# Patient Record
Sex: Male | Born: 1980 | Race: White | Hispanic: No | Marital: Married | Smoking: Former smoker
Health system: Southern US, Community
[De-identification: ages and names within clinical notes are randomized; demographics above are authoritative.]

## PROBLEM LIST (undated history)

## (undated) DIAGNOSIS — E781 Pure hyperglyceridemia: Secondary | ICD-10-CM

## (undated) DIAGNOSIS — S42301A Unspecified fracture of shaft of humerus, right arm, initial encounter for closed fracture: Secondary | ICD-10-CM

## (undated) DIAGNOSIS — E039 Hypothyroidism, unspecified: Secondary | ICD-10-CM

## (undated) DIAGNOSIS — S4991XA Unspecified injury of right shoulder and upper arm, initial encounter: Secondary | ICD-10-CM

## (undated) DIAGNOSIS — S82891A Other fracture of right lower leg, initial encounter for closed fracture: Secondary | ICD-10-CM

## (undated) DIAGNOSIS — R945 Abnormal results of liver function studies: Secondary | ICD-10-CM

## (undated) DIAGNOSIS — F419 Anxiety disorder, unspecified: Secondary | ICD-10-CM

## (undated) DIAGNOSIS — S93409A Sprain of unspecified ligament of unspecified ankle, initial encounter: Secondary | ICD-10-CM

## (undated) DIAGNOSIS — G44229 Chronic tension-type headache, not intractable: Secondary | ICD-10-CM

## (undated) HISTORY — DX: Abnormal results of liver function studies: R94.5

## (undated) HISTORY — DX: Pure hyperglyceridemia: E78.1

## (undated) HISTORY — DX: Sprain of unspecified ligament of unspecified ankle, initial encounter: S93.409A

## (undated) HISTORY — DX: Other fracture of right lower leg, initial encounter for closed fracture: S82.891A

## (undated) HISTORY — DX: Anxiety disorder, unspecified: F41.9

## (undated) HISTORY — DX: Unspecified fracture of shaft of humerus, right arm, initial encounter for closed fracture: S42.301A

## (undated) HISTORY — DX: Chronic tension-type headache, not intractable: G44.229

## (undated) HISTORY — DX: Hypothyroidism, unspecified: E03.9

## (undated) HISTORY — DX: Unspecified injury of right shoulder and upper arm, initial encounter: S49.91XA

---
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
## 2002-06-11 HISTORY — PX: WISDOM TOOTH EXTRACTION: SHX21

## 2004-06-11 DIAGNOSIS — S4991XA Unspecified injury of right shoulder and upper arm, initial encounter: Secondary | ICD-10-CM

## 2004-06-11 HISTORY — DX: Unspecified injury of right shoulder and upper arm, initial encounter: S49.91XA

## 2007-01-14 ENCOUNTER — Ambulatory Visit: Payer: Self-pay | Admitting: Family Medicine

## 2007-01-14 DIAGNOSIS — F411 Generalized anxiety disorder: Secondary | ICD-10-CM | POA: Insufficient documentation

## 2007-02-05 ENCOUNTER — Ambulatory Visit: Payer: Self-pay | Admitting: Family Medicine

## 2007-03-26 ENCOUNTER — Ambulatory Visit: Payer: Self-pay | Admitting: Internal Medicine

## 2007-03-26 ENCOUNTER — Ambulatory Visit: Payer: Self-pay | Admitting: Family Medicine

## 2007-04-02 LAB — CONVERTED CEMR LAB
ALT: 70 units/L — ABNORMAL HIGH (ref 0–53)
AST: 41 units/L — ABNORMAL HIGH (ref 0–37)
Bilirubin, Direct: 0.1 mg/dL (ref 0.0–0.3)
CO2: 28 meq/L (ref 19–32)
Calcium: 9.1 mg/dL (ref 8.4–10.5)
Cholesterol: 199 mg/dL (ref 0–200)
Creatinine, Ser: 0.8 mg/dL (ref 0.4–1.5)
Direct LDL: 129.7 mg/dL
GFR calc Af Amer: 149 mL/min
GFR calc non Af Amer: 123 mL/min
Glucose, Bld: 94 mg/dL (ref 70–99)
HCT: 44.4 % (ref 39.0–52.0)
Hemoglobin: 15.1 g/dL (ref 13.0–17.0)
Lymphocytes Relative: 42.5 % (ref 12.0–46.0)
Monocytes Relative: 9.9 % (ref 3.0–11.0)
Neutro Abs: 2.8 10*3/uL (ref 1.4–7.7)
Platelets: 281 10*3/uL (ref 150–400)
Potassium: 3.9 meq/L (ref 3.5–5.1)
RBC: 4.91 M/uL (ref 4.22–5.81)
Total Bilirubin: 1 mg/dL (ref 0.3–1.2)
VLDL: 43 mg/dL — ABNORMAL HIGH (ref 0–40)

## 2007-04-21 ENCOUNTER — Ambulatory Visit: Payer: Self-pay | Admitting: Family Medicine

## 2007-04-21 DIAGNOSIS — E039 Hypothyroidism, unspecified: Secondary | ICD-10-CM | POA: Insufficient documentation

## 2007-04-21 DIAGNOSIS — E782 Mixed hyperlipidemia: Secondary | ICD-10-CM | POA: Insufficient documentation

## 2007-04-21 DIAGNOSIS — R945 Abnormal results of liver function studies: Secondary | ICD-10-CM | POA: Insufficient documentation

## 2007-04-24 ENCOUNTER — Encounter: Admission: RE | Admit: 2007-04-24 | Discharge: 2007-04-24 | Payer: Self-pay | Admitting: Family Medicine

## 2007-06-10 ENCOUNTER — Ambulatory Visit: Payer: Self-pay | Admitting: Internal Medicine

## 2007-06-11 ENCOUNTER — Encounter (INDEPENDENT_AMBULATORY_CARE_PROVIDER_SITE_OTHER): Payer: Self-pay | Admitting: Internal Medicine

## 2007-08-14 ENCOUNTER — Ambulatory Visit: Payer: Self-pay | Admitting: Family Medicine

## 2007-08-15 LAB — CONVERTED CEMR LAB
Direct LDL: 107.3 mg/dL
HDL: 27.3 mg/dL — ABNORMAL LOW (ref >39.0)
Triglycerides: 281 mg/dL (ref 0–149)

## 2007-08-21 ENCOUNTER — Encounter (INDEPENDENT_AMBULATORY_CARE_PROVIDER_SITE_OTHER): Payer: Self-pay | Admitting: *Deleted

## 2007-11-10 ENCOUNTER — Telehealth (INDEPENDENT_AMBULATORY_CARE_PROVIDER_SITE_OTHER): Payer: Self-pay | Admitting: *Deleted

## 2008-06-15 ENCOUNTER — Ambulatory Visit: Payer: Self-pay | Admitting: Family Medicine

## 2008-06-15 DIAGNOSIS — J069 Acute upper respiratory infection, unspecified: Secondary | ICD-10-CM | POA: Insufficient documentation

## 2008-06-24 ENCOUNTER — Ambulatory Visit: Payer: Self-pay | Admitting: Family Medicine

## 2008-06-24 DIAGNOSIS — M25579 Pain in unspecified ankle and joints of unspecified foot: Secondary | ICD-10-CM | POA: Insufficient documentation

## 2008-06-24 DIAGNOSIS — S93409A Sprain of unspecified ligament of unspecified ankle, initial encounter: Secondary | ICD-10-CM | POA: Insufficient documentation

## 2008-07-15 ENCOUNTER — Ambulatory Visit: Payer: Self-pay | Admitting: Family Medicine

## 2008-08-10 ENCOUNTER — Ambulatory Visit: Payer: Self-pay | Admitting: Family Medicine

## 2008-08-11 LAB — CONVERTED CEMR LAB
ALT: 22 units/L (ref 0–53)
Alkaline Phosphatase: 75 units/L (ref 39–117)
Bilirubin, Direct: 0.1 mg/dL (ref 0.0–0.3)
Cholesterol: 209 mg/dL (ref 0–200)
HDL: 29.6 mg/dL — ABNORMAL LOW (ref >39.0)
Total CHOL/HDL Ratio: 7.1
Triglycerides: 245 mg/dL (ref 0–149)
VLDL: 49 mg/dL — ABNORMAL HIGH (ref 0–40)

## 2009-02-10 ENCOUNTER — Ambulatory Visit: Payer: Self-pay | Admitting: Family Medicine

## 2009-02-11 LAB — CONVERTED CEMR LAB
Cholesterol: 164 mg/dL (ref 0–200)
LDL Cholesterol: 101 mg/dL — ABNORMAL HIGH (ref 0–99)

## 2009-06-08 ENCOUNTER — Ambulatory Visit: Payer: Self-pay | Admitting: Family Medicine

## 2009-08-24 ENCOUNTER — Ambulatory Visit: Payer: Self-pay | Admitting: Family Medicine

## 2009-08-25 LAB — CONVERTED CEMR LAB
LDL Cholesterol: 110 mg/dL — ABNORMAL HIGH (ref 0–99)
Triglycerides: 176 mg/dL — ABNORMAL HIGH (ref 0.0–149.0)

## 2010-01-17 ENCOUNTER — Ambulatory Visit: Payer: Self-pay | Admitting: Family Medicine

## 2010-07-12 NOTE — Assessment & Plan Note (Signed)
Summary: TRANSFER FROM BILLIE/REFILL MED/CLE   Vital Signs:  Patient profile:   30 year old male Height:      73.25 inches Weight:      233.50 pounds BMI:     30.71 Temp:     97.8 degrees F oral Pulse rate:   84 / minute Pulse rhythm:   regular BP sitting:   122 / 70  (left arm) Cuff size:   large  Vitals Entered By: Delilah Shan CMA Duncan Dull) (January 17, 2010 3:01 PM) CC: Transfer from BDB   History of Present Illness: Hypothyroidism-  no symptoms of over/underreplacement.  No neck mass, dysphagia.  Doing well.  Complaint.   H/o elevated TG.  d/w patient EA:VWUJ today.  Exercising and working on diet.  Improved from before.   Allergies: No Known Drug Allergies  Past History:  Past Medical History: Anxiety with stress tension headaches Hypothyroid elevated TG  Family History: Reviewed history from 01/14/2007 and no changes required. Father: L&W Mother: L&W Siblings: 2 half sisters--L&W                                                                                                     1br--L&W               2 half br--L&W                                                                                               DM --none MI--none CVA--none prostate ca--none breast/uterine/cervix--none colon ca--none   Social History: Reviewed history from 06/08/2009 and no changes required. 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  Otherwise negative.    Physical Exam  General:  GEN: nad, alert and oriented HEENT: mucous membranes moist NECK: supple w/o LA, no tmg  CV: rrr.  no murmur PULM: ctab, no inc wob ABD: soft, +bs EXT: no edema SKIN: no acute rash    Impression & Recommendations:  Problem # 1:  HYPOTHYROIDISM (ICD-244.9) No change in  meds.  recheck TSH in 1 year.  His updated medication list for this problem includes:    Levothroid 75 Mcg Tabs (Levothyroxine sodium) .Marland Kitchen... Take one by mouth daily  Problem # 2:  HYPERLIPIDEMIA (ICD-272.2) d/w patient.  continue exercise.  would recheck in 1-2 years.   Complete Medication List: 1)  One-daily Multivitamins Tabs (Multiple vitamin) .... Take one by mouth daily 2)  Levothroid 75 Mcg Tabs (Levothyroxine sodium) .... Take one by mouth daily  3)  Fish Oil Oil (Fish oil) .... Three daily  Patient Instructions: 1)  Don't change your meds.  Keep exercising and get a flu shot this fall.  Prescriptions: LEVOTHROID 75 MCG  TABS (LEVOTHYROXINE SODIUM) Take one by mouth daily  #90 x 4   Entered and Authorized by:   Crawford Givens MD   Signed by:   Crawford Givens MD on 01/17/2010   Method used:   Electronically to        [facility]* (retail)       [address]       [place], [place]  [address]       Ph: [phone]       Fax: [phone]   RxID:   [id number]   Current Allergies (reviewed today): No known allergies

## 2010-11-01 ENCOUNTER — Encounter: Payer: Self-pay | Admitting: Family Medicine

## 2010-11-03 ENCOUNTER — Ambulatory Visit (INDEPENDENT_AMBULATORY_CARE_PROVIDER_SITE_OTHER): Payer: 59 | Admitting: Family Medicine

## 2010-11-03 ENCOUNTER — Encounter: Payer: Self-pay | Admitting: Family Medicine

## 2010-11-03 VITALS — BP 130/80 | HR 67 | Temp 97.9°F | Ht 74.0 in | Wt 229.4 lb

## 2010-11-03 DIAGNOSIS — W57XXXA Bitten or stung by nonvenomous insect and other nonvenomous arthropods, initial encounter: Secondary | ICD-10-CM

## 2010-11-03 MED ORDER — DOXYCYCLINE HYCLATE 100 MG PO TABS: 100.0000 mg | ORAL_TABLET | Freq: Two times a day (BID) | ORAL | Status: AC

## 2010-11-03 NOTE — Assessment & Plan Note (Addendum)
Hold doxy for now as the exam is benign.  Border marked.  Use doxy if erythema spreads.  He understood.  Areas dressed with bandaid and neosporin.  He may have had a transient superficial infection of the epithelial disruption due to a bite, but he doesn't have spreading erythema and is well appearing.  D/w about risk of empiric abx and it is reasonable to hold for now.  I'm on call this weekend and patient can call the on call line as needed.  No indication for I&D,  I expect this to gradually self resolve.  He agreed with plan.

## 2010-11-03 NOTE — Progress Notes (Signed)
"  Bug bite."  Likely bug bites at wedding in [place].  Also had been doing some mountain biking.  Lesion on L calf was getting bigger and had to lance it at home.  It had clear/yellow discharge.  Smaller, similar lesions on R shin (x2).  He's keeping them clean.  No tick bites.  No FCNAV.  Areas are itchy.  Size increase has slowed down over last few days.  Feeling well o/w.  Meds, vitals, and allergies reviewed.   ROS: See HPI.  Otherwise, noncontributory.  nad Skin with 3 lesions noted.  1 on L calf, 2.x1.5cm with blanching erythema.  Resolving vesicle w/o purulent discharge noted.  Not ttp.   R shin with 2 lesions, 1 is 1x1cm with similar resolving vesicle and blanching erythema.  No purulent discharge.  Another lesion is flat w/o vesicle, 2.5x1.5cm.  It has blanching erythema.  None are tender.

## 2010-11-03 NOTE — Patient Instructions (Signed)
Hold the antibiotic for now.  Fill it if your have spreading redness. Let us know if this is the case.  The medicine can make you sun sensitive.  Take care.

## 2011-02-01 ENCOUNTER — Encounter: Payer: Self-pay | Admitting: Family Medicine

## 2011-02-01 ENCOUNTER — Ambulatory Visit (INDEPENDENT_AMBULATORY_CARE_PROVIDER_SITE_OTHER): Payer: 59 | Admitting: Family Medicine

## 2011-02-01 ENCOUNTER — Other Ambulatory Visit: Payer: Self-pay | Admitting: *Deleted

## 2011-02-01 VITALS — BP 122/70 | HR 72 | Temp 98.0°F | Wt 224.0 lb

## 2011-02-01 DIAGNOSIS — M549 Dorsalgia, unspecified: Secondary | ICD-10-CM

## 2011-02-01 DIAGNOSIS — E039 Hypothyroidism, unspecified: Secondary | ICD-10-CM

## 2011-02-01 MED ORDER — CYCLOBENZAPRINE HCL 10 MG PO TABS: 5.0000 mg | ORAL_TABLET | Freq: Three times a day (TID) | ORAL | Status: DC | PRN

## 2011-02-01 MED ORDER — LEVOTHYROXINE SODIUM 75 MCG PO TABS: 75.0000 ug | ORAL_TABLET | Freq: Every day | ORAL | Status: DC

## 2011-02-01 NOTE — Telephone Encounter (Signed)
TSH 2.68 on 08/24/09

## 2011-02-01 NOTE — Patient Instructions (Signed)
I would gently stretch your back, use a heating pad, take ibuprofen with food, and use the flexeril for the muscle tightness.  I would get the seat hight adjusted on your bike.   You can get your results through our phone system.  Follow the instructions on the blue card.  Take care.

## 2011-02-01 NOTE — Telephone Encounter (Signed)
Pt may need labs.

## 2011-02-01 NOTE — Progress Notes (Signed)
Back pain.  5 months ago [place]  He went to chiropracter and got adjustment.  He got some better.  Mountain biking recently, had been racing some.  Back started hurting 2 days ago.  "Usually sore in lower back after 18-19 miles. This isn't the usual soreness."  Taking ibuprofen with some relief of pain.  Pain is better at night, supine.  He'd like to keep biking.  L>R, lower L spine, paraspinal/facet area.  No weakness in legs.  Occ pain in L thigh, better with position change. His seat is high on his bike.   Hypothyroid, due for recheck TSH and needs refill.  No neck sx.   Meds, vitals, and allergies reviewed.   ROS: See HPI.  Otherwise, noncontributory.  nad ncat Neck supple, no masses Back w/o midline pain but B paraspinal tenderness and muscle tightness.  SLR neg but facet loading is pos.  Distally nv intact

## 2011-02-02 DIAGNOSIS — M549 Dorsalgia, unspecified: Secondary | ICD-10-CM | POA: Insufficient documentation

## 2011-02-02 LAB — TSH: TSH: 1.33 u[IU]/mL (ref 0.35–5.50)

## 2011-02-02 NOTE — Assessment & Plan Note (Signed)
Likely facet irritation and muscle spasm.  No need to image.  D/w pt about knee to chest, cat/camel stretch. Heat, nsaid with GI caution and flexeril prn.  He'll get set height adjusted.

## 2011-02-02 NOTE — Assessment & Plan Note (Signed)
Check TSH and continue meds.

## 2011-06-11 ENCOUNTER — Emergency Department (INDEPENDENT_AMBULATORY_CARE_PROVIDER_SITE_OTHER): Payer: BC Managed Care – PPO

## 2011-06-11 ENCOUNTER — Other Ambulatory Visit: Payer: Self-pay

## 2011-06-11 ENCOUNTER — Emergency Department (HOSPITAL_BASED_OUTPATIENT_CLINIC_OR_DEPARTMENT_OTHER)
Admission: EM | Admit: 2011-06-11 | Discharge: 2011-06-11 | Disposition: A | Discharge status: Home / Self Care | Payer: BC Managed Care – PPO | Attending: Emergency Medicine | Admitting: Emergency Medicine

## 2011-06-11 ENCOUNTER — Encounter (HOSPITAL_BASED_OUTPATIENT_CLINIC_OR_DEPARTMENT_OTHER): Payer: Self-pay | Admitting: Emergency Medicine

## 2011-06-11 DIAGNOSIS — E785 Hyperlipidemia, unspecified: Secondary | ICD-10-CM | POA: Insufficient documentation

## 2011-06-11 DIAGNOSIS — J8410 Pulmonary fibrosis, unspecified: Secondary | ICD-10-CM

## 2011-06-11 DIAGNOSIS — R079 Chest pain, unspecified: Secondary | ICD-10-CM

## 2011-06-11 DIAGNOSIS — E039 Hypothyroidism, unspecified: Secondary | ICD-10-CM | POA: Insufficient documentation

## 2011-06-11 DIAGNOSIS — Z79899 Other long term (current) drug therapy: Secondary | ICD-10-CM | POA: Insufficient documentation

## 2011-06-11 LAB — CBC
Hemoglobin: 15.4 g/dL (ref 13.0–17.0)
MCH: 29.7 pg (ref 26.0–34.0)
MCHC: 35.5 g/dL (ref 30.0–36.0)
MCV: 83.6 fL (ref 78.0–100.0)
Platelets: 254 10*3/uL (ref 150–400)
RBC: 5.19 MIL/uL (ref 4.22–5.81)
RDW: 12.4 % (ref 11.5–15.5)
WBC: 10.4 10*3/uL (ref 4.0–10.5)

## 2011-06-11 LAB — DIFFERENTIAL
Eosinophils Absolute: 0.1 10*3/uL (ref 0.0–0.7)
Lymphs Abs: 1.8 10*3/uL (ref 0.7–4.0)
Monocytes Absolute: 0.7 10*3/uL (ref 0.1–1.0)
Monocytes Relative: 7 % (ref 3–12)
Neutro Abs: 7.8 10*3/uL — ABNORMAL HIGH (ref 1.7–7.7)

## 2011-06-11 LAB — BASIC METABOLIC PANEL
Calcium: 9.4 mg/dL (ref 8.4–10.5)
Chloride: 102 mEq/L (ref 96–112)
Glucose, Bld: 101 mg/dL — ABNORMAL HIGH (ref 70–99)
Potassium: 3.9 mEq/L (ref 3.5–5.1)

## 2011-06-11 MED ORDER — ASPIRIN 81 MG PO CHEW
324.0000 mg | CHEWABLE_TABLET | Freq: Once | ORAL | Status: AC
  Administered 2011-06-11: 324 mg via ORAL
  Filled 2011-06-11: qty 4

## 2011-06-11 NOTE — ED Notes (Signed)
Denies pain at this time [place] PA in at side

## 2011-06-11 NOTE — ED Provider Notes (Signed)
History     CSN: [id number]  Arrival date & time 06/11/11  1803   First MD Initiated Contact with Patient 06/11/11 1928      Chief Complaint  Patient presents with  . Chest Pain    (Consider location/radiation/quality/duration/timing/severity/associated sxs/prior treatment) Patient is a 30 y.o. male presenting with chest pain. The history is provided by the patient. No language interpreter was used.  Chest Pain The chest pain began more than 2 weeks ago. Chest pain occurs constantly. The chest pain is unchanged. The pain is associated with stress. At its most intense, the pain is at 8/10. The pain is currently at 8/10. The severity of the pain is moderate. The quality of the pain is described as aching and brief. The pain does not radiate. Chest pain is worsened by stress. He tried nothing for the symptoms. Risk factors include male gender.  Pertinent negatives for past medical history include no diabetes, no hyperlipidemia and no hypertension.  Pertinent negatives for family medical history include: no CAD in family, no heart disease in family and no hypertension in family.  Procedure history is negative for cardiac catheterization, echocardiogram and exercise treadmill test.    Pt reports he has had some chest discomfort on and off for the past 3 weeks.  Pt reports he had an episode today at work around 3pm.   Pt reports he has a sharp pain in his chest.  Pt reports the pain makes him feel anxious.  Pt reports he has had increased stress.  Pt is followed by [facility] Past Medical History  Diagnosis Date  . Anxiety     with stress  . Chronic tension headaches   . Hypothyroid   . High triglycerides   . Sprain of ankle, unspecified site     Left  . Nonspecific abnormal results of liver function study   . Acute upper respiratory infections of unspecified site   . Right shoulder injury 2006    no surgery; no fracture  . Right arm fracture 1988  . Ankle fracture, right  1996    ?; not sure if it was a break or growth plate separation    Past Surgical History  Procedure Date  . Hernia repair 1987    bilateral  . Wisdom tooth extraction 2004    Family History  Problem Relation Age of Onset  . Healthy Father   . Healthy Mother   . Healthy Sister     halfsister  . Healthy Sister     halfsister  . Healthy Brother     halfbrother  . Healthy Brother     halfbrother    History  Substance Use Topics  . Smoking status: Former Games developer  . Smokeless tobacco: Not on file  . Alcohol Use: Yes     some on weekends      Review of Systems  Cardiovascular: Positive for chest pain.  All other systems reviewed and are negative.    Allergies  Review of patient's allergies indicates no known allergies.  Home Medications   Current Outpatient Rx  Name Route Sig Dispense Refill  . ASPIRIN 325 MG PO TBEC Oral Take 325 mg by mouth daily.      . OMEGA-3 FATTY ACIDS 1000 MG PO CAPS Oral Take 2 g by mouth 2 (two) times daily.      . IBUPROFEN 200 MG PO TABS Oral Take 400 mg by mouth every 6 (six) hours as needed. For pain     .  LEVOTHYROXINE SODIUM 75 MCG PO TABS Oral Take 1 tablet (75 mcg total) by mouth daily. 90 tablet 3  . ONE-DAILY MULTI VITAMINS PO TABS Oral Take 1 tablet by mouth daily.        BP 162/81  Pulse 84  Temp(Src) 97.9 F (36.6 C) (Oral)  Resp 16  SpO2 100%  Physical Exam  Nursing note and vitals reviewed. Constitutional: He is oriented to person, place, and time. He appears well-developed.  HENT:  Head: Normocephalic and atraumatic.  Right Ear: External ear normal.  Left Ear: External ear normal.  Mouth/Throat: Oropharynx is clear and moist.  Eyes: Conjunctivae are normal. Pupils are equal, round, and reactive to light.  Neck: Normal range of motion. Neck supple.  Cardiovascular: Normal rate and normal heart sounds.   Pulmonary/Chest: He exhibits tenderness.  Abdominal: Soft.  Musculoskeletal: Normal range of motion.    Neurological: He is alert and oriented to person, place, and time. He has normal reflexes.  Skin: Skin is warm.  Psychiatric: He has a normal mood and affect.    ED Course  Procedures (including critical care time)  Labs Reviewed - No data to display No results found.   No diagnosis found.    MDM  Pt given 324 of aspirin.  Labs normal,  Chest xray and EKG reviewed.  I advised aspirin.  Schedule appointment at [facility] for recheck and to schedule possible stress test.  I doubt cardiac probable stress        Langston Masker, [place] 06/11/11 2108

## 2011-06-11 NOTE — ED Notes (Signed)
Pt reports multiple episodes of  sharp CP to RT 7 LT side chest x 10 days; reports he becomes anxious when CP occurs & feels like he is going to have a panic attack

## 2011-06-12 NOTE — ED Provider Notes (Signed)
History/physical exam/procedure(s) were performed by non-physician practitioner and as supervising physician I was immediately available for consultation/collaboration. I have reviewed all notes and am in agreement with care and plan.   Hilario Quarry, MD 06/12/11 [phone]

## 2011-06-14 ENCOUNTER — Ambulatory Visit (INDEPENDENT_AMBULATORY_CARE_PROVIDER_SITE_OTHER): Payer: BC Managed Care – PPO | Admitting: Family Medicine

## 2011-06-14 ENCOUNTER — Encounter: Payer: Self-pay | Admitting: Family Medicine

## 2011-06-14 DIAGNOSIS — R0789 Other chest pain: Secondary | ICD-10-CM

## 2011-06-14 NOTE — Patient Instructions (Signed)
Cut out the chocolate and alcohol for now.  Take pepcid twice a day for about 2 weeks. If not fully better, then try taking prilosec 20mg  a day for about 2 weeks.  If still not better, call me.  Try to get some rest and start back exercising gradually.

## 2011-06-14 NOTE — Progress Notes (Signed)
Was at ER 06/11/11 for CP.  Had unremarkable ER eval, now on pepcid AC before meals twice daily and the pain is some better but not resolved.  Still with some lower chest and upper abd pain, anterior.  He'd had episodes of this prev, but this episode has lasted longer. This is the worst time of year for him at work.  Sometimes the pain gets worse with a meal- this happened yesterday.    He's looking at his diet.  Worse with coffee.  He cut out etoh and chocolate.  Work is still tough, in terms of his schedule.  He's also taking classes online.  He isn't biking as much with the weather changes.   No early CAD in family.  No personal hx CAD.  No cocaine use.  BP wnl today.  He'd been able to bike up to 30 miles per day this summer.    Meds, vitals, and allergies reviewed.   ROS: See HPI.  Otherwise, noncontributory.  GEN: nad, alert and oriented, affect wnl HEENT: mucous membranes moist NECK: supple w/o LA CV: rrr.  no murmur, chest wall not ttp PULM: ctab, no inc wob ABD: soft, +bs, minimally ttp in epigastrum EXT: no edema SKIN: no acute rash

## 2011-06-15 ENCOUNTER — Encounter: Payer: Self-pay | Admitting: Family Medicine

## 2011-06-15 DIAGNOSIS — R0789 Other chest pain: Secondary | ICD-10-CM | POA: Insufficient documentation

## 2011-06-15 NOTE — Assessment & Plan Note (Signed)
Very unlikely CV source given his prev exercise tolerance.  Likely GERD related.  Continue H2 blocker, work on diet and exercise, can change to PPI if not resolved. If still with sx beyond that, then f/u here.  He agrees. Defer further w/u at this time.  He agrees with plan.

## 2012-01-16 ENCOUNTER — Other Ambulatory Visit: Payer: Self-pay | Admitting: *Deleted

## 2012-01-16 MED ORDER — LEVOTHYROXINE SODIUM 75 MCG PO TABS: 75.0000 ug | ORAL_TABLET | Freq: Every day | ORAL | Status: DC

## 2012-08-27 ENCOUNTER — Ambulatory Visit (INDEPENDENT_AMBULATORY_CARE_PROVIDER_SITE_OTHER): Payer: BC Managed Care – PPO | Admitting: Family Medicine

## 2012-08-27 ENCOUNTER — Encounter: Payer: Self-pay | Admitting: Family Medicine

## 2012-08-27 VITALS — BP 120/70 | HR 68 | Temp 98.0°F | Wt 237.0 lb

## 2012-08-27 DIAGNOSIS — J069 Acute upper respiratory infection, unspecified: Secondary | ICD-10-CM | POA: Insufficient documentation

## 2012-08-27 NOTE — Assessment & Plan Note (Signed)
No reason to suspect ominous dx, d/w pt.  Likely just "bad luck" with several URIs (as would occ be expected).  Reassuring exam, supportive tx and f/u prn.  He agrees.  Well appearing.

## 2012-08-27 NOTE — Patient Instructions (Addendum)
This looks like a cold and it should resolve.  Take care and start exrcising when the weather improves.

## 2012-08-27 NOTE — Progress Notes (Signed)
His prev atypical CP had resolved after a few weeks of pepcid.    He's been sick 3 times in the last 3 months.  He had a flu shot, likely had the flu in December (mild per patient, wasn't tested).  Prev this month with cold sx, ST, HA, sinus pressure.  It resolved.  Now with return of similar sx in the last few days.  Prev he had been well for years.   In last few days, had aches, rhinorrhea, mild ST, dry nose, sinus pressure.  Not as severe as the episodes earlier this month.  No rash, no ear pain but occ intermittent ear ringing x 2.    Continues to work long hours and exercise has been disrupted with bad weather.    GEN: nad, alert and oriented HEENT: mucous membranes moist, tm w/o erythema, nasal exam w/o erythema, clear discharge noted,  OP with cobblestoning, R>L SOM noted NECK: supple w/o LA CV: rrr.   PULM: ctab, no inc wob EXT: no edema SKIN: no acute rash

## 2013-02-23 ENCOUNTER — Other Ambulatory Visit: Payer: Self-pay | Admitting: Family Medicine

## 2013-02-23 DIAGNOSIS — E039 Hypothyroidism, unspecified: Secondary | ICD-10-CM

## 2013-02-23 NOTE — Telephone Encounter (Signed)
Electronic refill request. Patient not seen in some time.  Please advise.

## 2013-02-23 NOTE — Telephone Encounter (Signed)
Seen in March.  Sent  Needs TSH before OV this fall.

## 2013-02-24 NOTE — Telephone Encounter (Signed)
Left detailed message on voicemail.  

## 2013-09-03 ENCOUNTER — Other Ambulatory Visit: Payer: Self-pay | Admitting: Family Medicine

## 2013-09-03 DIAGNOSIS — E039 Hypothyroidism, unspecified: Secondary | ICD-10-CM

## 2013-09-03 NOTE — Telephone Encounter (Signed)
Electronic refill request. Last OV:  08/27/12  Last filled:  90 tablets 1RF on  02/23/2013  Patient has not been seen in greater than 1 year with no upcoming appt scheduled.  According to refill protocol, please advise.

## 2013-09-03 NOTE — Telephone Encounter (Signed)
Get him a lab appointment and then a f/u here. visit. Thanks. rx sent.  Order is in.

## 2013-09-03 NOTE — Telephone Encounter (Signed)
Patient advised.  Lab appt scheduled and OV scheduled. 

## 2013-09-04 ENCOUNTER — Other Ambulatory Visit (INDEPENDENT_AMBULATORY_CARE_PROVIDER_SITE_OTHER): Payer: BC Managed Care – PPO

## 2013-09-04 DIAGNOSIS — E782 Mixed hyperlipidemia: Secondary | ICD-10-CM

## 2013-09-04 DIAGNOSIS — E039 Hypothyroidism, unspecified: Secondary | ICD-10-CM

## 2013-09-04 LAB — TSH: TSH: 1.34 u[IU]/mL (ref 0.35–5.50)

## 2013-09-10 ENCOUNTER — Ambulatory Visit (INDEPENDENT_AMBULATORY_CARE_PROVIDER_SITE_OTHER): Payer: BC Managed Care – PPO | Admitting: Family Medicine

## 2013-09-10 ENCOUNTER — Encounter: Payer: Self-pay | Admitting: Family Medicine

## 2013-09-10 VITALS — BP 114/80 | HR 63 | Temp 98.0°F | Wt 234.0 lb

## 2013-09-10 DIAGNOSIS — Z9109 Other allergy status, other than to drugs and biological substances: Secondary | ICD-10-CM | POA: Insufficient documentation

## 2013-09-10 DIAGNOSIS — E039 Hypothyroidism, unspecified: Secondary | ICD-10-CM

## 2013-09-10 DIAGNOSIS — L989 Disorder of the skin and subcutaneous tissue, unspecified: Secondary | ICD-10-CM

## 2013-09-10 DIAGNOSIS — L409 Psoriasis, unspecified: Secondary | ICD-10-CM | POA: Insufficient documentation

## 2013-09-10 MED ORDER — LEVOTHYROXINE SODIUM 75 MCG PO TABS: ORAL_TABLET | ORAL | Status: DC

## 2013-09-10 MED ORDER — LORATADINE 10 MG PO TABS: 10.0000 mg | ORAL_TABLET | Freq: Every day | ORAL | Status: DC

## 2013-09-10 NOTE — Assessment & Plan Note (Signed)
No neck mass.  No dysphagia.  TSH wnl.  Labs d/w pt.

## 2013-09-10 NOTE — Assessment & Plan Note (Signed)
Possible scar tissue on the lateral dorsum on L hand, not ttp.  Wouldn't benefit from xray.  Would not I&D for possible FB given the duration and the lack of sx.  He agrees.

## 2013-09-10 NOTE — Assessment & Plan Note (Signed)
Likely, with cats, can try OTC antihistamine and avoidance.

## 2013-09-10 NOTE — Patient Instructions (Signed)
Don't change your thyroid medicine.   Recheck in about 1 year.  Try claritin before being around the cats.  See if that helps.

## 2013-09-10 NOTE — Progress Notes (Signed)
Pre visit review using our clinic review tool, if applicable. No additional management support is needed unless otherwise documented below in the visit note.  He was cutting roses in 12/14 and scraped his L hand.  He felt something at the site since then but not ttp  Hypothyroid.  No neck mass.  No dysphagia.  TSH wnl.  Labs d/w pt.   He has some episodic SOB, no wheezing.  No cough.  Unclear if allergy sx.  Some throat clearing.  Sx worse with cat exposure.    Meds, vitals, and allergies reviewed.   ROS: See HPI.  Otherwise, noncontributory.  GEN: nad, alert and oriented HEENT: mucous membranes moist NECK: supple w/o LA, no TMG CV: rrr.  PULM: ctab, no inc wob ABD: soft, +bs EXT: no edema SKIN: no acute rash but possible scar tissue on the lateral dorsum on L hand, not ttp

## 2013-10-08 ENCOUNTER — Ambulatory Visit (INDEPENDENT_AMBULATORY_CARE_PROVIDER_SITE_OTHER): Payer: BC Managed Care – PPO | Admitting: Family Medicine

## 2013-10-08 ENCOUNTER — Encounter: Payer: Self-pay | Admitting: Family Medicine

## 2013-10-08 VITALS — BP 118/78 | HR 63 | Temp 97.7°F | Wt 231.5 lb

## 2013-10-08 DIAGNOSIS — L989 Disorder of the skin and subcutaneous tissue, unspecified: Secondary | ICD-10-CM

## 2013-10-08 NOTE — Assessment & Plan Note (Signed)
He could have had a transiently inflamed lesion near the L nipple w/o being involved with the nipple per se.  Much improved now.  Would follow clinically.  If persisting, worsening, then he'll notify me.  It is clearly better already per patient report.

## 2013-10-08 NOTE — Patient Instructions (Signed)
Notify me if the area gets more tender or enlarged. Take care.

## 2013-10-08 NOTE — Progress Notes (Signed)
Pre visit review using our clinic review tool, if applicable. No additional management support is needed unless otherwise documented below in the visit note.  Saturday he had L nipple pain.  By that night it was more puffy.  "It felt like an ingrown hair."  Felt something under the nipple.  Sunday it was some better, less puffy.  Better again on Monday.  Not painful now.  Minimally palpable mass noted now.  No R sided sx  Feels well o/w.  No vision change, no FCNAVD.  No nipple discharge.  Prev superior edge of the nipple was red tender and puffy, not at all now.    Meds, vitals, and allergies reviewed.   ROS: See HPI.  Otherwise, noncontributory.  nad Very small possible lesion deep in the skin at L nipple, on the superior edge of the nipple.  Not ttp now.   No mass o/w.  No LA on the clavicle or in the axilla.   rrr ctab

## 2014-12-15 ENCOUNTER — Other Ambulatory Visit: Payer: Self-pay | Admitting: Family Medicine

## 2015-01-11 ENCOUNTER — Other Ambulatory Visit: Payer: Self-pay | Admitting: Family Medicine

## 2015-01-11 ENCOUNTER — Other Ambulatory Visit (INDEPENDENT_AMBULATORY_CARE_PROVIDER_SITE_OTHER): Payer: BLUE CROSS/BLUE SHIELD

## 2015-01-11 DIAGNOSIS — Z131 Encounter for screening for diabetes mellitus: Secondary | ICD-10-CM

## 2015-01-11 DIAGNOSIS — E039 Hypothyroidism, unspecified: Secondary | ICD-10-CM

## 2015-01-11 LAB — GLUCOSE, RANDOM: Glucose, Bld: 90 mg/dL (ref 70–99)

## 2015-01-11 LAB — TSH: TSH: 2.4 u[IU]/mL (ref 0.35–4.50)

## 2015-01-12 ENCOUNTER — Other Ambulatory Visit: Payer: Self-pay

## 2015-01-14 ENCOUNTER — Other Ambulatory Visit: Payer: Self-pay | Admitting: Family Medicine

## 2015-01-20 ENCOUNTER — Encounter: Payer: Self-pay | Admitting: Family Medicine

## 2015-01-20 ENCOUNTER — Ambulatory Visit (INDEPENDENT_AMBULATORY_CARE_PROVIDER_SITE_OTHER): Payer: BLUE CROSS/BLUE SHIELD | Admitting: Family Medicine

## 2015-01-20 VITALS — BP 108/62 | HR 83 | Temp 98.3°F | Wt 234.5 lb

## 2015-01-20 DIAGNOSIS — E039 Hypothyroidism, unspecified: Secondary | ICD-10-CM | POA: Diagnosis not present

## 2015-01-20 DIAGNOSIS — Z7189 Other specified counseling: Secondary | ICD-10-CM | POA: Diagnosis not present

## 2015-01-20 MED ORDER — LEVOTHYROXINE SODIUM 75 MCG PO TABS: 75.0000 ug | ORAL_TABLET | Freq: Every day | ORAL | Status: DC

## 2015-01-20 NOTE — Progress Notes (Signed)
Pre visit review using our clinic review tool, if applicable. No additional management support is needed unless otherwise documented below in the visit note.  Has a 31.6 month old son.  Wife was able to get some time off.  He's in daycare.  Family is doing well overall.    Hypothyroid.  Complaint. No masses, no lumps.  No dysphagia.  TSH wnl.    He had tdap done this year.    Meds, vitals, and allergies reviewed.   ROS: See HPI.  Otherwise, noncontributory.  GEN: nad, alert and oriented HEENT: mucous membranes moist NECK: supple w/o LA, no tmg CV: rrr.  PULM: ctab, no inc wob ABD: soft, +bs EXT: no edema SKIN: no acute rash

## 2015-01-20 NOTE — Patient Instructions (Signed)
Congrats.  Take care.  Glad to see you.

## 2015-01-21 ENCOUNTER — Encounter: Payer: Self-pay | Admitting: Family Medicine

## 2015-01-21 DIAGNOSIS — Z7189 Other specified counseling: Secondary | ICD-10-CM | POA: Insufficient documentation

## 2015-01-21 NOTE — Assessment & Plan Note (Signed)
Controlled, continue as is.  Doing well.  Recheck in about 1 year.

## 2015-08-18 ENCOUNTER — Encounter: Payer: Self-pay | Admitting: Family Medicine

## 2015-08-18 ENCOUNTER — Ambulatory Visit (INDEPENDENT_AMBULATORY_CARE_PROVIDER_SITE_OTHER): Payer: BLUE CROSS/BLUE SHIELD | Admitting: Family Medicine

## 2015-08-18 VITALS — BP 102/74 | HR 64 | Temp 97.4°F | Wt 233.2 lb

## 2015-08-18 DIAGNOSIS — R059 Cough, unspecified: Secondary | ICD-10-CM | POA: Insufficient documentation

## 2015-08-18 DIAGNOSIS — R05 Cough: Secondary | ICD-10-CM | POA: Diagnosis not present

## 2015-08-18 MED ORDER — AZITHROMYCIN 250 MG PO TABS
ORAL_TABLET | ORAL | Status: DC
Start: 2015-08-18 — End: 2016-01-17

## 2015-08-18 MED ORDER — BENZONATATE 200 MG PO CAPS: 200.0000 mg | ORAL_CAPSULE | Freq: Three times a day (TID) | ORAL | Status: DC | PRN

## 2015-08-18 NOTE — Patient Instructions (Signed)
Rest and fluids, zithromax and tessalon. Okay to use mucinex with a lot of fluids.  Take care.  Glad to see you.

## 2015-08-18 NOTE — Assessment & Plan Note (Signed)
Likely bronchitis. Still well appearing o/w.  Okay for outpatient f/u.  Start zmax, use tessalon prn, rest and fluids.

## 2015-08-18 NOTE — Progress Notes (Signed)
Pre visit review using our clinic review tool, if applicable. No additional management support is needed unless otherwise documented below in the visit note.  Cough.  3111 month old child in daycare.  Patient (father) has been sick monthly for the last year since the child has been in daycare.   Now with cough, with sputum, going on for weeks.   He's worn out from work, ie busy.  Wife had been sick for weeks with similar.   He'll have fevers and chills, lasting a few days, then sx will wax and wane.  He isn't SOB.   No vomiting, no diarrhea.    Meds, vitals, and allergies reviewed.   ROS: See HPI.  Otherwise, noncontributory.  GEN: nad, alert and oriented HEENT: mucous membranes moist, tm w/o erythema, mild L SOM but no erythema B, nasal exam w/o erythema, clear discharge noted,  OP with mild cobblestoning NECK: supple w/o LA CV: rrr.   PULM: ctab except for scant B upper and lower field rhonchi at the end of expiration, no inc wob, no wheeze.  EXT: no edema

## 2015-11-01 ENCOUNTER — Ambulatory Visit: Payer: BLUE CROSS/BLUE SHIELD | Admitting: Primary Care

## 2015-11-01 ENCOUNTER — Ambulatory Visit: Payer: BLUE CROSS/BLUE SHIELD | Admitting: Family Medicine

## 2016-01-17 ENCOUNTER — Ambulatory Visit (INDEPENDENT_AMBULATORY_CARE_PROVIDER_SITE_OTHER): Payer: BLUE CROSS/BLUE SHIELD | Admitting: Family Medicine

## 2016-01-17 ENCOUNTER — Encounter: Payer: Self-pay | Admitting: Family Medicine

## 2016-01-17 VITALS — BP 104/76 | HR 65 | Temp 97.5°F | Wt 234.0 lb

## 2016-01-17 DIAGNOSIS — E039 Hypothyroidism, unspecified: Secondary | ICD-10-CM

## 2016-01-17 DIAGNOSIS — K219 Gastro-esophageal reflux disease without esophagitis: Secondary | ICD-10-CM

## 2016-01-17 LAB — TSH: TSH: 1.53 u[IU]/mL (ref 0.35–4.50)

## 2016-01-17 MED ORDER — LEVOTHYROXINE SODIUM 75 MCG PO TABS: 75.0000 ug | ORAL_TABLET | Freq: Every day | ORAL | 3 refills | Status: DC

## 2016-01-17 NOTE — Progress Notes (Signed)
Still biking w/o CP.  Weight is stable.    Prev to ER years ago.  Presumed GERD.  Was on tx for GERD in the meantime and did better.  Over the last year, started to have some atypical chest discomfort.  Upper abd discomfort, intermittently.  Now in the last 1.5 months, he had a ST but didn't feel sick o/w, ie presumed LPR.  In the last few weeks had been drinking more coffee, one episode of chest pain caused anxiety.  D/w pt. Restarted zantac in the meantime with some relief but still with sour feeling in stomach.  ST is resolved.  He cut out coffee in the meantime.  He quit eating before bed.  Drinking more water.  D/w pt about elevating the head of the bed.  No blood in stool.  No black stools.  Not vomiting, not vomiting blood.  Prev with nausea, resolved with zantac use.  He had taken some ibuprofen prev, stopped in the meantime.    Hypothyroidism.  tsh pending.  Compliant.  No ade on med. No neck mass or dysphagia.   Meds, vitals, and allergies reviewed.   ROS: Per HPI unless specifically indicated in ROS section   GEN: nad, alert and oriented HEENT: mucous membranes moist NECK: supple w/o LA, no tmg CV: rrr PULM: ctab, no inc wob ABD: soft, +bs EXT: no edema SKIN: no acute rash

## 2016-01-17 NOTE — Patient Instructions (Signed)
Go to the lab on the way out.  We'll contact you with your lab report. Add on tums midday, before lunch.  Update me if not better in about 2 weeks.  We may need to do the H pylori test at that point.  Don't take prilosec of similar for now.

## 2016-01-17 NOTE — Progress Notes (Signed)
Pre visit review using our clinic review tool, if applicable. No additional management support is needed unless otherwise documented below in the visit note. 

## 2016-01-18 DIAGNOSIS — K219 Gastro-esophageal reflux disease without esophagitis: Secondary | ICD-10-CM | POA: Insufficient documentation

## 2016-01-18 NOTE — Assessment & Plan Note (Signed)
Likely, some better now, with some residual sx.  He has made lifestyle interventions.  Add on tums midday, before lunch.  Update me if not better in about 2 weeks.  We may need to do the H pylori test at that point.  Don't take prilosec of similar for now.  He agrees.

## 2016-01-18 NOTE — Assessment & Plan Note (Signed)
See notes on labs.  no tmg.

## 2016-04-17 DIAGNOSIS — H40013 Open angle with borderline findings, low risk, bilateral: Secondary | ICD-10-CM | POA: Diagnosis not present

## 2016-07-16 ENCOUNTER — Telehealth: Payer: Self-pay

## 2016-07-16 MED ORDER — OSELTAMIVIR PHOSPHATE 75 MG PO CAPS: 75.0000 mg | ORAL_CAPSULE | Freq: Every day | ORAL | 0 refills | Status: DC

## 2016-07-16 NOTE — Telephone Encounter (Signed)
Pt left v/m; flu has been confirmed in pts home and [facility] Pt request cb.

## 2016-07-16 NOTE — Telephone Encounter (Signed)
If he hasn't gotten a flu shot, then get one.   rx sent.   If he has sx, then change to BID dosing.  Thanks.

## 2016-07-16 NOTE — Telephone Encounter (Signed)
Spoke with Larry Wilkinson.  He is not currently having flu symptoms and he states he did get a flu shot this year.

## 2016-09-25 ENCOUNTER — Encounter: Payer: Self-pay | Admitting: Family Medicine

## 2016-09-25 ENCOUNTER — Ambulatory Visit (INDEPENDENT_AMBULATORY_CARE_PROVIDER_SITE_OTHER): Payer: BLUE CROSS/BLUE SHIELD | Admitting: Family Medicine

## 2016-09-25 DIAGNOSIS — R059 Cough, unspecified: Secondary | ICD-10-CM

## 2016-09-25 DIAGNOSIS — R05 Cough: Secondary | ICD-10-CM

## 2016-09-25 MED ORDER — ALBUTEROL SULFATE HFA 108 (90 BASE) MCG/ACT IN AERS: 1.0000 | INHALATION_SPRAY | Freq: Four times a day (QID) | RESPIRATORY_TRACT | 0 refills | Status: DC | PRN

## 2016-09-25 MED ORDER — DOXYCYCLINE HYCLATE 100 MG PO TABS: 100.0000 mg | ORAL_TABLET | Freq: Two times a day (BID) | ORAL | 0 refills | Status: DC

## 2016-09-25 NOTE — Progress Notes (Signed)
Wife had a cough and ST.  She was recently treated.  Son is in daycare, he was recently sick.  About 4 days ago he had fever and ST.  Took tylenol.  Then yesterday with some cough.  Rusty/dark brown colored sputum.  Chest feels congested and irritated.  ST sx are better in the meantime.    Once he had some bloody sputum this AM but then since this his sputum is clearer.    tmax 101 recently, up and down recently.    He has been sick for about 5 days, he felt overall like he was still getting worse.    Some wheeze occ noted by patient.    Meds, vitals, and allergies reviewed.   ROS: Per HPI unless specifically indicated in ROS section   GEN: nad, alert and oriented HEENT: mucous membranes moist, tm w/o erythema, nasal exam w/o erythema, clear discharge noted,  OP with cobblestoning, sinuses not ttp NECK: supple w/o LA CV: rrr.   PULM: ctab, no inc wob EXT: no edema SKIN: no acute rash

## 2016-09-25 NOTE — Progress Notes (Signed)
Pre visit review using our clinic review tool, if applicable. No additional management support is needed unless otherwise documented below in the visit note. 

## 2016-09-25 NOTE — Patient Instructions (Signed)
Likely bronchitis.   Use the inhaler, drink plenty of fluids and get some rest.   Okay to hold the antibiotics for a day or two.   Take care.  Glad to see you.  Update me as needed.

## 2016-09-26 NOTE — Assessment & Plan Note (Signed)
Likely bronchitis.  Clearly nontoxic. Okay for outpatient follow-up. Discussed with patient about differential diagnosis. He has sick exposures at home and there is a good chance that this could've been viral. If so should gradually improve. He likely has some blood-tinged sputum from airway irritation not from an ominous cause of overt hemoptysis. Discussed with patient. Use SABA prn, drink plenty of fluids and get some rest.   Okay to hold the antibiotics for a day or two.  Okay to start antibiotics if not improved in the near future. He agrees. Update me as needed

## 2016-12-30 ENCOUNTER — Encounter: Payer: Self-pay | Admitting: Family Medicine

## 2016-12-30 DIAGNOSIS — B029 Zoster without complications: Secondary | ICD-10-CM | POA: Diagnosis not present

## 2016-12-31 ENCOUNTER — Encounter: Payer: Self-pay | Admitting: Family Medicine

## 2016-12-31 ENCOUNTER — Ambulatory Visit (INDEPENDENT_AMBULATORY_CARE_PROVIDER_SITE_OTHER): Payer: BLUE CROSS/BLUE SHIELD | Admitting: Family Medicine

## 2016-12-31 DIAGNOSIS — B029 Zoster without complications: Secondary | ICD-10-CM

## 2016-12-31 MED ORDER — GABAPENTIN 100 MG PO CAPS: 100.0000 mg | ORAL_CAPSULE | Freq: Three times a day (TID) | ORAL | 1 refills | Status: DC

## 2016-12-31 NOTE — Assessment & Plan Note (Signed)
He may have second nerve involvement, in the R lower back, but it may not progress since already on valtrex.  D/w pt about shingles cautions and tx, continue valtrex, add on gabapentin if needed.  Update me as needed.  He agrees.

## 2016-12-31 NOTE — Patient Instructions (Addendum)
Keep the area covered.  Finish the valtrex.  They should gradually scab over.  Let me know if you have spots that didn't every scab over by the end of the valtrex.   Use gabapentin if needed for pain along with ibuprofen.  Start with a low dose.  Take care.  Glad to see you.

## 2016-12-31 NOTE — Progress Notes (Signed)
[facility].  L shoulder and neck and arm rash.  Started with tingling and pain locally.  Then rash.  Sx for about 1 week.  Already on valtrex.   Single lesion on the R lower back in different dermatome- single blistered lesion.    Son has already been vaccinated for VZV.  D/w pt.    Meds, vitals, and allergies reviewed.   ROS: Per HPI unless specifically indicated in ROS section   nad ncat L ~C4 dermatome with blanching vesicular rash that doesn't cross the midline w/o ulceration or abscess.   Single lesion on the R lower back in different dermatome- single blistered lesion.

## 2017-03-25 ENCOUNTER — Other Ambulatory Visit: Payer: Self-pay | Admitting: Family Medicine

## 2017-03-25 NOTE — Telephone Encounter (Signed)
Electronic refill request. Levothyroxine Last office visit:   12/31/16 Last TSH  01/17/16 Last Filled:    90 tablet 3 01/17/2016  Please advise.

## 2017-03-26 NOTE — Telephone Encounter (Signed)
Sent.  TSH prior to follow-up when possible. Thanks.

## 2017-06-17 DIAGNOSIS — H40013 Open angle with borderline findings, low risk, bilateral: Secondary | ICD-10-CM | POA: Diagnosis not present

## 2017-09-20 ENCOUNTER — Telehealth: Payer: Self-pay | Admitting: Family Medicine

## 2017-09-20 ENCOUNTER — Other Ambulatory Visit: Payer: Self-pay | Admitting: Family Medicine

## 2017-09-20 ENCOUNTER — Encounter: Payer: Self-pay | Admitting: *Deleted

## 2017-09-20 MED ORDER — LEVOTHYROXINE SODIUM 75 MCG PO TABS: 75.0000 ug | ORAL_TABLET | Freq: Every day | ORAL | 0 refills | Status: DC

## 2017-09-20 NOTE — Telephone Encounter (Signed)
Copied from CRM [id number]. Topic: Quick Communication - Rx Refill/Question >> Sep 20, 2017  3:03 PM Diana EvesHoyt, Maryann B wrote: Medication: levothyroxine (SYNTHROID, LEVOTHROID) 75 MCG tablet  Has the patient contacted their pharmacy? Yes.   (Agent: If no, request that the patient contact the pharmacy for the refill.)  Preferred Pharmacy (with phone number or street name): [id number] [place], [place] - [address] Agent: Please be advised that RX refills may take up to 3 business days. We ask that you follow-up with your pharmacy.

## 2017-09-20 NOTE — Telephone Encounter (Signed)
Left message for pt to call office back to schedule appt for refills of Levothyroxine. Last TSH level  and OV on 01/17/16.   PCP[facility]can  [facility] [place]

## 2017-09-20 NOTE — Telephone Encounter (Signed)
30 day supply was sent to pharmacy with notation that patient must make appointment prior to further refills.  Letter mailed.

## 2017-09-20 NOTE — Telephone Encounter (Signed)
Do you want to provide him a refill or make OV first? It looks like he has missed a few doses.

## 2017-09-22 NOTE — Telephone Encounter (Signed)
Thanks.  Agreed.  Will see at OV, when possible.

## 2017-09-23 NOTE — Telephone Encounter (Signed)
Medication was sent in on Friday, September 20, 2017 with notation that an appointment must be made for additional refills.  Letter mailed also.

## 2017-10-22 ENCOUNTER — Ambulatory Visit: Payer: BLUE CROSS/BLUE SHIELD | Admitting: Family Medicine

## 2017-10-22 ENCOUNTER — Encounter: Payer: Self-pay | Admitting: Family Medicine

## 2017-10-22 VITALS — BP 120/80 | HR 61 | Temp 97.9°F | Ht 74.0 in | Wt 233.5 lb

## 2017-10-22 DIAGNOSIS — Z13220 Encounter for screening for lipoid disorders: Secondary | ICD-10-CM | POA: Diagnosis not present

## 2017-10-22 DIAGNOSIS — E039 Hypothyroidism, unspecified: Secondary | ICD-10-CM | POA: Diagnosis not present

## 2017-10-22 DIAGNOSIS — Z131 Encounter for screening for diabetes mellitus: Secondary | ICD-10-CM

## 2017-10-22 DIAGNOSIS — L918 Other hypertrophic disorders of the skin: Secondary | ICD-10-CM

## 2017-10-22 DIAGNOSIS — L989 Disorder of the skin and subcutaneous tissue, unspecified: Secondary | ICD-10-CM

## 2017-10-22 LAB — LIPID PANEL
CHOLESTEROL: 177 mg/dL (ref 0–200)
HDL: 41.7 mg/dL (ref >39.00)
LDL Cholesterol: 113 mg/dL — ABNORMAL HIGH (ref 0–99)
NONHDL: 134.92
Total CHOL/HDL Ratio: 4
Triglycerides: 108 mg/dL (ref 0.0–149.0)
VLDL: 21.6 mg/dL (ref 0.0–40.0)

## 2017-10-22 LAB — TSH: TSH: 1.62 u[IU]/mL (ref 0.35–4.50)

## 2017-10-22 LAB — GLUCOSE, RANDOM: GLUCOSE: 92 mg/dL (ref 70–99)

## 2017-10-22 MED ORDER — LEVOTHYROXINE SODIUM 75 MCG PO TABS: 75.0000 ug | ORAL_TABLET | Freq: Every day | ORAL | 3 refills | Status: DC

## 2017-10-22 NOTE — Patient Instructions (Addendum)
Go to the lab on the way out.  We'll contact you with your lab report. Lesions frozen today.  It will blister and the should heal over.  Use wart pads as needed.   Take care.  Glad to see you.  I can take off the skin tags at another visit.

## 2017-10-22 NOTE — Progress Notes (Signed)
Hypothyroidism.  Some fatigue noted.  He thought it was related to timing of medicine.  He wasn't taking it on an empty stomach but started doing so now and that is helping.  No neck mass, no dysphagia.  Compliant o/w.   Due for labs.    He has a possible plantar's wart on R foot.  Pain with walking barefoot.  We talked about options, opted for freezing and consideration of tx options.    Meds, vitals, and allergies reviewed.   ROS: Per HPI unless specifically indicated in ROS section   GEN: nad, alert and oriented HEENT: mucous membranes moist NECK: supple w/o LA, no tmg CV: rrr PULM: ctab, no inc wob ABD: soft, +bs EXT: no edema SKIN: no acute rash R foot with plantar lesion near the distal fifth metatarsal.  It looks like a small warty lesion.  Verbal informed consent obtained and area frozen x3 with adequate thawing.  Tolerated well.  No complications. He does have an irritated skin tag on the left side of his neck and in the right axilla.

## 2017-10-23 DIAGNOSIS — Z13220 Encounter for screening for lipoid disorders: Secondary | ICD-10-CM | POA: Insufficient documentation

## 2017-10-23 DIAGNOSIS — L918 Other hypertrophic disorders of the skin: Secondary | ICD-10-CM | POA: Insufficient documentation

## 2017-10-23 DIAGNOSIS — Z0000 Encounter for general adult medical examination without abnormal findings: Secondary | ICD-10-CM | POA: Insufficient documentation

## 2017-10-23 NOTE — Assessment & Plan Note (Signed)
Return for visit for removal. D/w pt.  He agrees.

## 2017-10-23 NOTE — Assessment & Plan Note (Signed)
Warty lesion frozen x3 without complication.  Routine cautions and instructions given.  The area should blister over then heal up.  If he has any residual lesion he can use over-the-counter wart pads.  He is aware that no treatment method has 100% success rate with 1 trial.

## 2017-10-23 NOTE — Assessment & Plan Note (Signed)
No thyromegaly on exam.  Recheck TSH today.  No change in meds.  Update me as needed.  He agrees.  Discussed with patient about routine timing of medication, on an empty stomach.

## 2017-10-23 NOTE — Assessment & Plan Note (Signed)
See notes on labs. 

## 2017-11-28 DIAGNOSIS — L821 Other seborrheic keratosis: Secondary | ICD-10-CM | POA: Diagnosis not present

## 2017-11-28 DIAGNOSIS — L814 Other melanin hyperpigmentation: Secondary | ICD-10-CM | POA: Diagnosis not present

## 2017-11-28 DIAGNOSIS — L249 Irritant contact dermatitis, unspecified cause: Secondary | ICD-10-CM | POA: Diagnosis not present

## 2017-12-18 ENCOUNTER — Telehealth: Payer: BLUE CROSS/BLUE SHIELD | Admitting: Nurse Practitioner

## 2017-12-18 DIAGNOSIS — H5789 Other specified disorders of eye and adnexa: Secondary | ICD-10-CM

## 2017-12-18 MED ORDER — POLYMYXIN B-TRIMETHOPRIM 10000-0.1 UNIT/ML-% OP SOLN: 2.0000 [drp] | OPHTHALMIC | 0 refills | Status: DC

## 2017-12-18 NOTE — Progress Notes (Signed)

## 2018-01-09 ENCOUNTER — Encounter: Payer: Self-pay | Admitting: Family Medicine

## 2018-01-09 ENCOUNTER — Ambulatory Visit: Payer: BLUE CROSS/BLUE SHIELD | Admitting: Family Medicine

## 2018-01-09 VITALS — BP 124/80 | HR 56 | Temp 97.7°F | Ht 74.0 in | Wt 234.6 lb

## 2018-01-09 DIAGNOSIS — H1045 Other chronic allergic conjunctivitis: Secondary | ICD-10-CM

## 2018-01-09 DIAGNOSIS — H109 Unspecified conjunctivitis: Secondary | ICD-10-CM | POA: Insufficient documentation

## 2018-01-09 NOTE — Progress Notes (Signed)
[name] - 37 y.o. male MRN [id number]  Date of birth: [birth year]  Subjective Chief Complaint  Patient presents with  . Conjunctivitis    right eye is pink and crusty, itchy. Dx 2 weeks ago on Evisit with pink eye and given medication. It cleared up but has returned    HPI Larry Wilkinson is a 37 y.o. male here today with complaint of possible "pink eye".  Completed E-visit last week for symptoms and was prescribed rx for polytrim.  Initially reported symptoms began about a week ago but actually started a little over a month ago.   He used antibiotic drops for about 6 days before stopping.  Did notice some improvement but symptoms never fully resolved.  Describes symptoms as itching, mild crusting, pink coloration at times and clear discharge.  He does report a history of allergies and does not take anything for these.   He denies any vision changes.  He does not wear contact lenses.  He is not having pain with symptoms, fever, or chills.   ROS:  A comprehensive ROS was completed and negative except as noted per HPI  No Known Allergies  Past Medical History:  Diagnosis Date  . Ankle fracture, right 1996   ?; not sure if it was a break or growth plate separation  . Anxiety    with stress  . Chronic tension headaches   . High triglycerides   . Hypothyroid   . Nonspecific abnormal results of liver function study   . Right arm fracture 1988  . Right shoulder injury 2006   no surgery; no fracture  . Sprain of ankle, unspecified site    Left    Past Surgical History:  Procedure Laterality Date  . HERNIA REPAIR  1987   bilateral  . WISDOM TOOTH EXTRACTION  2004    Social History   Socioeconomic History  . Marital status: Married    Spouse name: Not on file  . Number of children: Not on file  . Years of education: Not on file  . Highest education level: Not on file  Occupational History  . Occupation: Nature conservation officer  Social Needs  . Financial resource strain: Not on  file  . Food insecurity:    Worry: Not on file    Inability: Not on file  . Transportation needs:    Medical: Not on file    Non-medical: Not on file  Tobacco Use  . Smoking status: Former Games developer  . Smokeless tobacco: Never Used  Substance and Sexual Activity  . Alcohol use: Yes    Comment: some on weekends  . Drug use: No  . Sexual activity: Not on file  Lifestyle  . Physical activity:    Days per week: Not on file    Minutes per session: Not on file  . Stress: Not on file  Relationships  . Social connections:    Talks on phone: Not on file    Gets together: Not on file    Attends religious service: Not on file    Active member of club or organization: Not on file    Attends meetings of clubs or organizations: Not on file    Relationship status: Not on file  Other Topics Concern  . 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 Healthy Mother   . Healthy Father   . Healthy Sister        halfsister  . Healthy Sister        halfsister  . Healthy Brother        halfbrother  . Healthy Brother        halfbrother    Health Maintenance  Topic Date Due  . HIV Screening  08/31/1994  . INFLUENZA VACCINE  01/09/2018  . TETANUS/TDAP  08/09/2024    ----------------------------------------------------------------------------------------------------------------------------------------------------------------------------------------------------------------- Physical Exam BP 124/80 (BP Location: Left Arm, Patient Position: Sitting, Cuff Size: Normal)   Pulse (!) 56   Temp 97.7 F (36.5 C) (Oral)   Ht 6\' 2"  (1.88 m)   Wt 234 lb 9.6 oz (106.4 kg)   SpO2 99%   BMI 30.12 kg/m   Physical Exam  Constitutional: He is oriented to person, place, and time. He appears well-nourished. No  distress.  HENT:  Head: Normocephalic and atraumatic.  Mouth/Throat: Oropharynx is clear and moist.  Eyes: Pupils are equal, round, and reactive to light. EOM are normal. Right eye exhibits discharge (clear). No scleral icterus.  Mild conjunctival injection of R eye.   Neck: Neck supple.  Lymphadenopathy:    He has no cervical adenopathy.  Neurological: He is alert and oriented to person, place, and time.  Skin: No rash noted.  Psychiatric: He has a normal mood and affect. His behavior is normal.    ------------------------------------------------------------------------------------------------------------------------------------------------------------------------------------------------------------------- Assessment and Plan  Conjunctivitis Given history of allergies, prolonged nature of symptoms and description I would favor allergic conjunctivitis as the cause. Recommend trial of OTC ketotifen If not improving can consider rx antihistamine drop May have some mild blepharitis contributing as well, recommend warm compresses daily for irritation/crusting.

## 2018-01-09 NOTE — Assessment & Plan Note (Signed)
Given history of allergies, prolonged nature of symptoms and description I would favor allergic conjunctivitis as the cause. Recommend trial of OTC ketotifen If not improving can consider rx antihistamine drop May have some mild blepharitis contributing as well, recommend warm compresses daily for irritation/crusting.

## 2018-01-09 NOTE — Patient Instructions (Addendum)
Try OTC antihistamine drop.  Look for active ingredient Ketotifen Let us know if not improving.   Allergic Conjunctivitis A clear membrane (conjunctiva) covers the white part of your eye and the inner surface of your eyelid. Allergic conjunctivitis happens when this membrane has inflammation. This is caused by allergies. Common causes of allergic reactions (allergens)include:  Outdoor allergens, such as: ? Pollen. ? Grass and weeds. ? Mold spores.  Indoor allergens, such as: ? Dust. ? Smoke. ? Mold. ? Pet dander. ? Animal hair.  This condition can make your eye red or pink. It can also make your eye feel itchy. This condition cannot be spread from one person to another person (is not contagious). Follow these instructions at home:  Try not to be around things that you are allergic to.  Take or apply over-the-counter and prescription medicines only as told by your doctor. These include any eye drops.  Place a cool, clean washcloth on your eye for 10-20 minutes. Do this 3-4 times a day.  Do not touch or rub your eyes.  Do not wear contact lenses until the inflammation is gone. Wear glasses instead.  Do not wear eye makeup until the inflammation is gone.  Keep all follow-up visits as told by your doctor. This is important. Contact a doctor if:  Your symptoms get worse.  Your symptoms do not get better with treatment.  You have mild eye pain.  You are sensitive to light,  You have spots or blisters on your eyes.  You have pus coming from your eye.  You have a fever. Get help right away if:  You have redness, swelling, or other symptoms in only one eye.  Your vision is blurry.  You have vision changes.  You have very bad eye pain. Summary  Allergic conjunctivitis is caused by allergies. It can make your eye red or pink, and it can make your eye feel itchy.  This condition cannot be spread from one person to another person (is not contagious).  Try not to be  around things that you are allergic to.  Take or apply over-the-counter and prescription medicines only as told by your doctor. These include any eye drops.  Contact your doctor if your symptoms get worse or they do not get better with treatment. This information is not intended to replace advice given to you by your health care provider. Make sure you discuss any questions you have with your health care provider. Document Released: 11/15/2009 Document Revised: 01/20/2016 Document Reviewed: 01/20/2016 Elsevier Interactive Patient Education  2017 [employer]

## 2018-03-31 DIAGNOSIS — Z23 Encounter for immunization: Secondary | ICD-10-CM | POA: Diagnosis not present

## 2018-06-18 DIAGNOSIS — H40013 Open angle with borderline findings, low risk, bilateral: Secondary | ICD-10-CM | POA: Diagnosis not present

## 2018-08-22 ENCOUNTER — Encounter: Payer: Self-pay | Admitting: Family Medicine

## 2018-08-22 ENCOUNTER — Ambulatory Visit: Payer: BLUE CROSS/BLUE SHIELD | Admitting: Family Medicine

## 2018-08-22 ENCOUNTER — Telehealth: Payer: Self-pay

## 2018-08-22 ENCOUNTER — Other Ambulatory Visit: Payer: Self-pay

## 2018-08-22 VITALS — BP 114/78 | HR 92 | Temp 98.1°F | Ht 74.0 in | Wt 230.2 lb

## 2018-08-22 DIAGNOSIS — J101 Influenza due to other identified influenza virus with other respiratory manifestations: Secondary | ICD-10-CM | POA: Insufficient documentation

## 2018-08-22 DIAGNOSIS — R6889 Other general symptoms and signs: Secondary | ICD-10-CM

## 2018-08-22 LAB — POCT INFLUENZA A/B
Influenza A, POC: POSITIVE — AB
Influenza B, POC: NEGATIVE

## 2018-08-22 MED ORDER — OSELTAMIVIR PHOSPHATE 75 MG PO CAPS: 75.0000 mg | ORAL_CAPSULE | Freq: Two times a day (BID) | ORAL | 0 refills | Status: AC

## 2018-08-22 NOTE — Progress Notes (Signed)
Larry Wilkinson - 38 y.o. male MRN [id number]  Date of birth: 07-22-1980  Subjective Chief Complaint  Patient presents with  . URI    Flu like Sy/Sx onset : 2 days, sick family contacts    HPI Larry Wilkinson is a 38 y.o. male who complains of congestion, sore throat, dry cough, myalgias, headache and fever for 2 days.  Son with influenza, diagnosed last week.  He denies a history of anorexia, chest pain, dizziness, nausea, shortness of breath, vomiting and wheezing and denies a history of asthma. Patient does not smoke cigarettes.  ROS:  A comprehensive ROS was completed and negative except as noted per HPI   No Known Allergies  Past Medical History:  Diagnosis Date  . Ankle fracture, right 1996   ?; not sure if it was a break or growth plate separation  . Anxiety    with stress  . Chronic tension headaches   . High triglycerides   . Hypothyroid   . Nonspecific abnormal results of liver function study   . Right arm fracture 1988  . Right shoulder injury 2006   no surgery; no fracture  . Sprain of ankle, unspecified site    Left    Past Surgical History:  Procedure Laterality Date  . HERNIA REPAIR  1987   bilateral  . WISDOM TOOTH EXTRACTION  2004    Social History   Socioeconomic History  . Marital status: Married    Spouse name: Not on file  . Number of children: Not on file  . Years of education: Not on file  . Highest education level: Not on file  Occupational History  . Occupation: Nature conservation officer  Social Needs  . Financial resource strain: Not on file  . Food insecurity:    Worry: Not on file    Inability: Not on file  . Transportation needs:    Medical: Not on file    Non-medical: Not on file  Tobacco Use  . Smoking status: Former Games developer  . Smokeless tobacco: Never Used  Substance and Sexual Activity  . Alcohol use: Yes    Comment: some on weekends  . Drug use: No  . Sexual activity: Not on file  Lifestyle  . Physical activity:    Days per  week: Not on file    Minutes per session: Not on file  . Stress: Not on file  Relationships  . Social connections:    Talks on phone: Not on file    Gets together: Not on file    Attends religious service: Not on file    Active member of club or organization: Not on file    Attends meetings of clubs or organizations: Not on file    Relationship status: Not on file  Other Topics Concern  . 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 Healthy Mother   . Healthy Father   . Healthy Sister        halfsister  . Healthy Sister        halfsister  . Healthy Brother        halfbrother  . Healthy Brother        halfbrother    Health Maintenance  Topic Date Due  . HIV Screening  08/31/1994  . INFLUENZA VACCINE  01/09/2018  . TETANUS/TDAP  08/09/2024    ----------------------------------------------------------------------------------------------------------------------------------------------------------------------------------------------------------------- Physical Exam BP 114/78   Pulse 92   Temp 98.1 F (36.7 C) (Oral)   Ht 6\' 2"  (1.88 m)   Wt 230 lb 3.2 oz (104.4 kg)   SpO2 98%   BMI 29.56 kg/m   Physical Exam Constitutional:      Appearance: Normal appearance.  HENT:     Head: Normocephalic and atraumatic.     Right Ear: Tympanic membrane normal.     Left Ear: Tympanic membrane normal.     Nose: No congestion.     Mouth/Throat:     Mouth: Mucous membranes are moist.  Neck:     Musculoskeletal: Neck supple.  Cardiovascular:     Rate and Rhythm: Normal rate and regular rhythm.  Pulmonary:     Effort: Pulmonary effort is normal.     Breath sounds: Normal breath sounds.  Skin:    General: Skin is warm and dry.  Neurological:     General: No focal deficit present.      Mental Status: He is alert and oriented to person, place, and time.  Psychiatric:        Mood and Affect: Mood normal.        Behavior: Behavior normal.     ------------------------------------------------------------------------------------------------------------------------------------------------------------------------------------------------------------------- Assessment and Plan  Influenza A -Positive influenza A test today -Start tamiflu -Symptomatic therapy suggested: push fluids, rest, gargle warm salt water, use vaporizer or mist prn and return office visit prn if symptoms persist or worsen. Lack of antibiotic effectiveness discussed with him. Call or return to clinic prn if these symptoms worsen or fail to improve as anticipated.

## 2018-08-22 NOTE — Assessment & Plan Note (Signed)
-  Positive influenza A test today -Start tamiflu -Symptomatic therapy suggested: push fluids, rest, gargle warm salt water, use vaporizer or mist prn and return office visit prn if symptoms persist or worsen. Lack of antibiotic effectiveness discussed with him. Call or return to clinic prn if these symptoms worsen or fail to improve as anticipated.

## 2018-08-22 NOTE — Telephone Encounter (Signed)
I will defer.  I was willing to send in tamiflu given wife's improvement.  He has OV scheduled at [facility].  I thank all involved.

## 2018-08-22 NOTE — Telephone Encounter (Signed)
Pt started on 08/20/18 at night with fever 101, chillls, achy,prod cough with yellow phlegm,and tired. No S/T or SOB. Pt wife was sick first with flu like symptoms and she went to the dr and had neg flu test but her provider gave her tamiflu and she has seen improvement in symptoms. pts son also has flu like symptoms. Pt did travel last wk to [place] but to his knowledge was not exposed to Covid. [facility] Dr Para March said would be able to send in tamiflu for pt. Then pt wanted to know if could get flu test. I advised would need to be seen and offered an appt at [facility] but pt wants to be seen at [facility] and wants tamiflu sent to pharmacy also. [place] CMA spoke with pt and has appt scheduled to see Dr Reece Agar 08/22/18 at 3:30. (Tamiflu was not sent in)FYI to Dr Reece Agar.

## 2018-08-22 NOTE — Patient Instructions (Signed)

## 2018-10-09 ENCOUNTER — Other Ambulatory Visit: Payer: Self-pay | Admitting: Family Medicine

## 2018-10-16 DIAGNOSIS — T1501XA Foreign body in cornea, right eye, initial encounter: Secondary | ICD-10-CM | POA: Diagnosis not present

## 2019-01-04 ENCOUNTER — Encounter (HOSPITAL_BASED_OUTPATIENT_CLINIC_OR_DEPARTMENT_OTHER): Payer: Self-pay | Admitting: Emergency Medicine

## 2019-01-04 ENCOUNTER — Other Ambulatory Visit: Payer: Self-pay

## 2019-01-04 ENCOUNTER — Emergency Department (HOSPITAL_BASED_OUTPATIENT_CLINIC_OR_DEPARTMENT_OTHER)
Admission: EM | Admit: 2019-01-04 | Discharge: 2019-01-04 | Disposition: A | Discharge status: Home / Self Care | Payer: BC Managed Care – PPO | Attending: Emergency Medicine | Admitting: Emergency Medicine

## 2019-01-04 ENCOUNTER — Emergency Department (HOSPITAL_BASED_OUTPATIENT_CLINIC_OR_DEPARTMENT_OTHER): Payer: BC Managed Care – PPO

## 2019-01-04 DIAGNOSIS — M79661 Pain in right lower leg: Secondary | ICD-10-CM | POA: Diagnosis not present

## 2019-01-04 DIAGNOSIS — E039 Hypothyroidism, unspecified: Secondary | ICD-10-CM | POA: Insufficient documentation

## 2019-01-04 DIAGNOSIS — Z87891 Personal history of nicotine dependence: Secondary | ICD-10-CM | POA: Insufficient documentation

## 2019-01-04 DIAGNOSIS — Z79899 Other long term (current) drug therapy: Secondary | ICD-10-CM | POA: Insufficient documentation

## 2019-01-04 DIAGNOSIS — Y999 Unspecified external cause status: Secondary | ICD-10-CM | POA: Insufficient documentation

## 2019-01-04 DIAGNOSIS — W11XXXA Fall on and from ladder, initial encounter: Secondary | ICD-10-CM | POA: Insufficient documentation

## 2019-01-04 DIAGNOSIS — Y9389 Activity, other specified: Secondary | ICD-10-CM | POA: Insufficient documentation

## 2019-01-04 DIAGNOSIS — S20211A Contusion of right front wall of thorax, initial encounter: Secondary | ICD-10-CM | POA: Insufficient documentation

## 2019-01-04 DIAGNOSIS — S8991XA Unspecified injury of right lower leg, initial encounter: Secondary | ICD-10-CM | POA: Diagnosis not present

## 2019-01-04 DIAGNOSIS — Y92007 Garden or yard of unspecified non-institutional (private) residence as the place of occurrence of the external cause: Secondary | ICD-10-CM | POA: Insufficient documentation

## 2019-01-04 DIAGNOSIS — S0083XA Contusion of other part of head, initial encounter: Secondary | ICD-10-CM | POA: Insufficient documentation

## 2019-01-04 DIAGNOSIS — S8011XA Contusion of right lower leg, initial encounter: Secondary | ICD-10-CM | POA: Diagnosis not present

## 2019-01-04 NOTE — ED Provider Notes (Signed)
[facility] EMERGENCY DEPARTMENT Provider Note   CSN: [id number] Arrival date & time: 01/04/19  1739    History   Chief Complaint Chief Complaint  Patient presents with  . Fall    HPI Eric Formton R Baswell is a 38 y.o. male.     HPI Patient states that roughly 45 minutes prior to presentation he fell off a short ladder onto his deck.  He complains of pain to the right anterior tibial region.  Has swelling at the site.  Has small contusion to the right rib cage and also of the left supraorbital ridge.  He denies passing out.  Denies neck pain.  No difficulty breathing.  He has been able to walk since the fall. Past Medical History:  Diagnosis Date  . Ankle fracture, right 1996   ?; not sure if it was a break or growth plate separation  . Anxiety    with stress  . Chronic tension headaches   . High triglycerides   . Hypothyroid   . Nonspecific abnormal results of liver function study   . Right arm fracture 1988  . Right shoulder injury 2006   no surgery; no fracture  . Sprain of ankle, unspecified site    Left    Patient Active Problem List   Diagnosis Date Noted  . Influenza A 08/22/2018  . Conjunctivitis 01/09/2018  . Skin tag 10/23/2017  . Lipid screening 10/23/2017  . GERD (gastroesophageal reflux disease) 01/18/2016  . Advance care planning 01/21/2015  . Environmental allergies 09/10/2013  . Skin lesion 09/10/2013  . Hypothyroidism 04/21/2007    Past Surgical History:  Procedure Laterality Date  . HERNIA REPAIR  1987   bilateral  . WISDOM TOOTH EXTRACTION  2004        Home Medications    Prior to Admission medications   Medication Sig Start Date End Date Taking? Authorizing Provider  albuterol (PROVENTIL HFA;VENTOLIN HFA) 108 (90 Base) MCG/ACT inhaler Inhale 1-2 puffs into the lungs every 6 (six) hours as needed for wheezing or shortness of breath. 09/25/16   Joaquim Namuncan, Graham S, MD  levothyroxine (SYNTHROID) 75 MCG tablet Take 1 tablet (75 mcg  total) by mouth daily. NEEDS VISIT WITH DOCTOR 10/09/18   Joaquim Namuncan, Graham S, MD  Multiple Vitamin (MULTIVITAMIN) tablet Take 1 tablet by mouth daily as needed.     [provider]  trimethoprim-polymyxin b (POLYTRIM) ophthalmic solution Place 2 drops into both eyes every 4 (four) hours. Patient not taking: Reported on 01/09/2018 12/18/17   Bennie PieriniMartin, Mary-Margaret, FNP    Family History Family History  Problem Relation Age of Onset  . Healthy Mother   . Healthy Father   . Healthy Sister        halfsister  . Healthy Sister        halfsister  . Healthy Brother        halfbrother  . Healthy Brother        halfbrother    Social History Social History   Tobacco Use  . Smoking status: Former Games developermoker  . Smokeless tobacco: Never Used  Substance Use Topics  . Alcohol use: Yes    Comment: some on weekends  . Drug use: No     Allergies   Patient has no known allergies.   Review of Systems Review of Systems  Constitutional: Negative for chills and fever.  Respiratory: Negative for shortness of breath.   Cardiovascular: Positive for leg swelling. Negative for chest pain and palpitations.  Gastrointestinal: Negative  for abdominal pain, nausea and vomiting.  Musculoskeletal: Positive for myalgias. Negative for arthralgias and neck pain.  Skin: Positive for wound.  Neurological: Negative for dizziness, syncope, weakness, numbness and headaches.  All other systems reviewed and are negative.    Physical Exam Updated Vital Signs BP 120/74 (BP Location: Right Arm)   Pulse 64   Temp 98.6 F (37 C) (Oral)   Resp 18   Ht 6\' 2"  (1.88 m)   Wt 95.3 kg   SpO2 100%   BMI 26.96 kg/m   Physical Exam Vitals signs and nursing note reviewed.  Constitutional:      Appearance: Normal appearance. He is well-developed.  HENT:     Head: Normocephalic.     Comments: Small contusion to the left supraorbital ridge.  Midface is stable.      Nose: Nose normal.     Mouth/Throat:      Mouth: Mucous membranes are moist.  Eyes:     Extraocular Movements: Extraocular movements intact.     Pupils: Pupils are equal, round, and reactive to light.  Neck:     Musculoskeletal: Normal range of motion and neck supple. No neck rigidity or muscular tenderness.     Comments: No posterior midline cervical tenderness to palpation. Cardiovascular:     Rate and Rhythm: Normal rate and regular rhythm.     Heart sounds: No murmur. No friction rub. No gallop.   Pulmonary:     Effort: Pulmonary effort is normal. No respiratory distress.     Breath sounds: Normal breath sounds. No stridor. No wheezing, rhonchi or rales.     Comments: Patient has a very small abrasion to the right lateral chest wall.  No real tenderness to palpation, crepitance or deformity.  Bilateral breath sounds intact. Chest:     Chest wall: No tenderness.  Abdominal:     General: Bowel sounds are normal.     Palpations: Abdomen is soft.     Tenderness: There is no abdominal tenderness. There is no guarding or rebound.  Musculoskeletal: Normal range of motion.        General: Swelling and tenderness present.     Comments: Full range of motion of the right knee and right ankle.  Patient has swelling, contusion and tenderness palpation over the mid anterior tibial region on the right.  Distal pulses intact.  No midline thoracic or lumbar tenderness.  Pelvis is stable.  Lymphadenopathy:     Cervical: No cervical adenopathy.  Skin:    General: Skin is warm and dry.     Findings: No erythema or rash.  Neurological:     General: No focal deficit present.     Mental Status: He is alert and oriented to person, place, and time.     Comments: 5/5 motor in all extremities.  Sensation fully intact.  Psychiatric:        Behavior: Behavior normal.      ED Treatments / Results  Labs (all labs ordered are listed, but only abnormal results are displayed) Labs Reviewed - No data to display  EKG None  Radiology Dg  Tibia/fibula Right  Result Date: 01/04/2019 CLINICAL DATA:  Fall, pain EXAM: RIGHT TIBIA AND FIBULA - 2 VIEW COMPARISON:  None. FINDINGS: No definite fracture or dislocation of the right tibia or fibula. There is an oblique lucency of the mid diaphysis of the right fibula, likely a vascular nutrient foramen although nondisplaced, incomplete fracture is not excluded. The partially imaged right knee and right  ankle are unremarkable. IMPRESSION: No definite fracture or dislocation of the right tibia or fibula. There is an oblique lucency of the mid diaphysis of the right fibula, likely a vascular nutrient foramen although nondisplaced, incomplete fracture is not excluded. The partially imaged right knee and right ankle are unremarkable. Electronically Signed   By: Lauralyn PrimesAlex  Bibbey M.D.   On: 01/04/2019 18:52    Procedures Procedures (including critical care time)  Medications Ordered in ED Medications - No data to display   Initial Impression / Assessment and Plan / ED Course  I have reviewed the triage vital signs and the nursing notes.  Pertinent labs & imaging results that were available during my care of the patient were reviewed by me and considered in my medical decision making (see chart for details).       Patient with no tenderness over the area in question on x-ray of the fibula.  Low suspicion for fracture.  Will treat with rice therapy.  Return precautions given.   Final Clinical Impressions(s) / ED Diagnoses   Final diagnoses:  Contusion of right lower extremity, initial encounter    ED Discharge Orders    None       Loren RacerYelverton, Salisha Bardsley, MD 01/04/19 2302

## 2019-01-04 NOTE — ED Triage Notes (Addendum)
R leg pain after falling 3 feet from a ladder. He hit his head. Denies LOC. Also c/o R rib and L elbow pain.

## 2019-01-11 ENCOUNTER — Encounter: Payer: Self-pay | Admitting: Family Medicine

## 2019-01-11 DIAGNOSIS — E039 Hypothyroidism, unspecified: Secondary | ICD-10-CM

## 2019-01-12 MED ORDER — LEVOTHYROXINE SODIUM 75 MCG PO TABS: 75.0000 ug | ORAL_TABLET | Freq: Every day | ORAL | 1 refills | Status: DC

## 2019-01-12 NOTE — Telephone Encounter (Signed)
Ordered. Thanks

## 2019-01-12 NOTE — Telephone Encounter (Signed)
Patient advised of everything and agreed to labs and virtual visit. Please place lab orders for 01/19/2019. Thank you

## 2019-01-12 NOTE — Telephone Encounter (Signed)
I sent the rx.  Talk to patient about lab visit protocol and try to set up a lab visit.  That should be low risk and okay to do.  All he needs is a nonfasting TSH with web/phone visit after that.  TSH ordered. Thanks.

## 2019-01-12 NOTE — Telephone Encounter (Signed)
OK to refill without labs? LOV 10/22/2017 and that was the last time lab work was done

## 2019-01-19 ENCOUNTER — Other Ambulatory Visit (INDEPENDENT_AMBULATORY_CARE_PROVIDER_SITE_OTHER): Payer: BC Managed Care – PPO

## 2019-01-19 ENCOUNTER — Other Ambulatory Visit: Payer: Self-pay

## 2019-01-19 DIAGNOSIS — E039 Hypothyroidism, unspecified: Secondary | ICD-10-CM | POA: Diagnosis not present

## 2019-01-19 LAB — TSH: TSH: 2.57 u[IU]/mL (ref 0.35–4.50)

## 2019-01-23 ENCOUNTER — Other Ambulatory Visit: Payer: Self-pay

## 2019-01-23 ENCOUNTER — Encounter: Payer: Self-pay | Admitting: Family Medicine

## 2019-01-23 ENCOUNTER — Ambulatory Visit (INDEPENDENT_AMBULATORY_CARE_PROVIDER_SITE_OTHER): Payer: BC Managed Care – PPO | Admitting: Family Medicine

## 2019-01-23 DIAGNOSIS — Y92009 Unspecified place in unspecified non-institutional (private) residence as the place of occurrence of the external cause: Secondary | ICD-10-CM | POA: Diagnosis not present

## 2019-01-23 DIAGNOSIS — E039 Hypothyroidism, unspecified: Secondary | ICD-10-CM

## 2019-01-23 DIAGNOSIS — W19XXXD Unspecified fall, subsequent encounter: Secondary | ICD-10-CM | POA: Diagnosis not present

## 2019-01-23 NOTE — Progress Notes (Signed)
Hypothyroidism.  No neck mass, no dysphagia.  Compliant.  Routine use d/w pt.   Labs d/w pt.  TSH wnl.   Pandemic considerations d/w pt.    He fell off a 4 ft ladder at home.  Fell onto the ladder, then fell off the porch.  He has been working on the deck/porch at the time.  ER eval done.  He had more rib pain after the event.  Ever since then, he has had some anterior chest pain that clearly improves with ibuprofen.  It isn't exertional.  It was likely re-aggravated after playing golf.  Leg pain is better from the fall but the R shin has numbness locally.   Stressors noted at work.  He hasn't been able to bike as much as he would like, as that is enjoyable for him.   Weight loss attributed to work at home, building a deck.  Also he has been working on diet.   Meds, vitals, and allergies reviewed.   ROS: Per HPI unless specifically indicated in ROS section   GEN: nad, alert and oriented HEENT: ncat NECK: supple w/o LA, no tmg CV: rrr PULM: ctab, no inc wob ABD: soft, +bs, no rebound. EXT: no edema SKIN: no acute rash R lower ribs ttp.   He has numbness on the L anterior shin with decreased sensitivity to light touch and temperature but he has normal vibration sensation locally.

## 2019-01-23 NOTE — Patient Instructions (Signed)
I would get a flu shot each fall.   Update me as needed.  Take care.  Glad to see you.  Don't change  Your meds for now.  The shin sensation changes may gradually get better.

## 2019-01-25 DIAGNOSIS — Y92009 Unspecified place in unspecified non-institutional (private) residence as the place of occurrence of the external cause: Secondary | ICD-10-CM | POA: Insufficient documentation

## 2019-01-25 DIAGNOSIS — W19XXXA Unspecified fall, initial encounter: Secondary | ICD-10-CM | POA: Insufficient documentation

## 2019-01-25 NOTE — Assessment & Plan Note (Signed)
No neck mass, no dysphagia.  Compliant.  Routine use d/w pt.   Labs d/w pt.  TSH wnl.  Continue as is.  He agrees.

## 2019-01-25 NOTE — Assessment & Plan Note (Signed)
Isolated incident.  He does not have high fall risk otherwise.  I think he likely has some chronic musculoskeletal/chest and abdominal wall irritation since the fall.  He likely has reaggravated this several times.  I think this is gradually going to improve.  He likely concussed a peripheral nerve on his right shin and this may slowly improve.  He does not have weakness and we talked about his situation overall.  He will update me as needed.

## 2019-04-26 DIAGNOSIS — Z20828 Contact with and (suspected) exposure to other viral communicable diseases: Secondary | ICD-10-CM | POA: Diagnosis not present

## 2019-04-29 ENCOUNTER — Encounter: Payer: Self-pay | Admitting: Family Medicine

## 2019-05-01 ENCOUNTER — Other Ambulatory Visit: Payer: Self-pay | Admitting: Family Medicine

## 2019-05-01 MED ORDER — TRIAMCINOLONE ACETONIDE 0.1 % EX CREA: 1.0000 "application " | TOPICAL_CREAM | Freq: Two times a day (BID) | CUTANEOUS | 0 refills | Status: DC

## 2019-05-22 ENCOUNTER — Encounter: Payer: Self-pay | Admitting: Family Medicine

## 2019-05-22 ENCOUNTER — Ambulatory Visit (INDEPENDENT_AMBULATORY_CARE_PROVIDER_SITE_OTHER): Payer: BC Managed Care – PPO | Admitting: Family Medicine

## 2019-05-22 ENCOUNTER — Other Ambulatory Visit: Payer: Self-pay

## 2019-05-22 VITALS — BP 120/78 | HR 69 | Temp 98.5°F | Ht 74.0 in | Wt 221.1 lb

## 2019-05-22 DIAGNOSIS — R42 Dizziness and giddiness: Secondary | ICD-10-CM

## 2019-05-22 DIAGNOSIS — M79642 Pain in left hand: Secondary | ICD-10-CM | POA: Diagnosis not present

## 2019-05-22 DIAGNOSIS — E039 Hypothyroidism, unspecified: Secondary | ICD-10-CM | POA: Diagnosis not present

## 2019-05-22 NOTE — Patient Instructions (Signed)
I think you had episode of benign positional vertigo. Do repositioning maneuvers provided today.  Let us know if not improving with this.   Benign Positional Vertigo Vertigo is the feeling that you or your surroundings are moving when they are not. Benign positional vertigo is the most common form of vertigo. This is usually a harmless condition (benign). This condition is positional. This means that symptoms are triggered by certain movements and positions. This condition can be dangerous if it occurs while you are doing something that could cause harm to you or others. This includes activities such as driving or operating machinery. What are the causes? In many cases, the cause of this condition is not known. It may be caused by a disturbance in an area of the inner ear that helps your brain to sense movement and balance. This disturbance can be caused by:  Viral infection (labyrinthitis).  Head injury.  Repetitive motion, such as jumping, dancing, or running. What increases the risk? You are more likely to develop this condition if:  You are a woman.  You are 12 years of age or older. What are the signs or symptoms? Symptoms of this condition usually happen when you move your head or your eyes in different directions. Symptoms may start suddenly, and usually last for less than a minute. They include:  Loss of balance and falling.  Feeling like you are spinning or moving.  Feeling like your surroundings are spinning or moving.  Nausea and vomiting.  Blurred vision.  Dizziness.  Involuntary eye movement (nystagmus). Symptoms can be mild and cause only minor problems, or they can be severe and interfere with daily life. Episodes of benign positional vertigo may return (recur) over time. Symptoms may improve over time. How is this diagnosed? This condition may be diagnosed based on:  Your medical history.  Physical exam of the head, neck, and ears.  Tests, such  as: ? MRI. ? CT scan. ? Eye movement tests. Your health care provider may ask you to change positions quickly while he or she watches you for symptoms of benign positional vertigo, such as nystagmus. Eye movement may be tested with a variety of exams that are designed to evaluate or stimulate vertigo. ? An electroencephalogram (EEG). This records electrical activity in your brain. ? Hearing tests. You may be referred to a health care provider who specializes in ear, nose, and throat (ENT) problems (otolaryngologist) or a provider who specializes in disorders of the nervous system (neurologist). How is this treated?  This condition may be treated in a session in which your health care provider moves your head in specific positions to adjust your inner ear back to normal. Treatment for this condition may take several sessions. Surgery may be needed in severe cases, but this is rare. In some cases, benign positional vertigo may resolve on its own in 2-4 weeks. Follow these instructions at home: Safety  Move slowly. Avoid sudden body or head movements or certain positions, as told by your health care provider.  Avoid driving until your health care provider says it is safe for you to do so.  Avoid operating heavy machinery until your health care provider says it is safe for you to do so.  Avoid doing any tasks that would be dangerous to you or others if vertigo occurs.  If you have trouble walking or keeping your balance, try using a cane for stability. If you feel dizzy or unstable, sit down right away.  Return to your  normal activities as told by your health care provider. Ask your health care provider what activities are safe for you. General instructions  Take over-the-counter and prescription medicines only as told by your health care provider.  Drink enough fluid to keep your urine pale yellow.  Keep all follow-up visits as told by your health care provider. This is  important. Contact a health care provider if:  You have a fever.  Your condition gets worse or you develop new symptoms.  Your family or friends notice any behavioral changes.  You have nausea or vomiting that gets worse.  You have numbness or a "pins and needles" sensation. Get help right away if you:  Have difficulty speaking or moving.  Are always dizzy.  Faint.  Develop severe headaches.  Have weakness in your legs or arms.  Have changes in your hearing or vision.  Develop a stiff neck.  Develop sensitivity to light. Summary  Vertigo is the feeling that you or your surroundings are moving when they are not. Benign positional vertigo is the most common form of vertigo.  The cause of this condition is not known. It may be caused by a disturbance in an area of the inner ear that helps your brain to sense movement and balance.  Symptoms include loss of balance and falling, feeling that you or your surroundings are moving, nausea and vomiting, and blurred vision.  This condition can be diagnosed based on symptoms, physical exam, and other tests, such as MRI, CT scan, eye movement tests, and hearing tests.  Follow safety instructions as told by your health care provider. You will also be told when to contact your health care provider in case of problems. This information is not intended to replace advice given to you by your health care provider. Make sure you discuss any questions you have with your health care provider. Document Released: 03/05/2006 Document Revised: 11/06/2017 Document Reviewed: 11/06/2017 Elsevier Patient Education  2020 [employer].

## 2019-05-22 NOTE — Progress Notes (Signed)
This visit was conducted in person.  BP 120/78 (BP Location: Left Arm, Patient Position: Sitting, Cuff Size: Normal)   Pulse 69   Temp 98.5 F (36.9 C) (Temporal)   Ht 6\' 2"  (1.88 m)   Wt 221 lb 2 oz (100.3 kg)   SpO2 98%   BMI 28.39 kg/m   Orthostatic VS for the past 24 hrs (Last 3 readings):  BP- Lying BP- Standing at 0 minutes  05/22/19 1446 -- 130/90  05/22/19 1444 140/84 --   CC: dizziness, hand pain Subjective:    Patient ID: Larry Wilkinson, male    DOB: 04-05-1981, 38 y.o.   MRN: [id number]  HPI: Larry Wilkinson is a 38 y.o. male presenting on 05/22/2019 for Dizziness (C/o dizziness and feeling off balance.  Started yesterday. ) and Hand Pain (C/o occasional nerve pain in left palm.  H/o shingles on left side about 2 yrs ago.)   1d h/o dizziness described as room spinning sensation worse with leaning head backwards. No syncope or significant imbalance. Took some advil. Today feeling overall better.  No chest pain, headache.  No hearing changes or ringing in ears No recent URI symptoms.  Did have salty fatty meals on Wed and Thurs (outside of normal).  Does endorse increased stress at work recently.   Occasional needle pain mid L palm.  H/o shingles to L arm 2 yrs ago.  Did play 9 holes of golf, left work early today, continues feeling not well.      Relevant past medical, surgical, family and social history reviewed and updated as indicated. Interim medical history since our last visit reviewed. Allergies and medications reviewed and updated. Outpatient Medications Prior to Visit  Medication Sig Dispense Refill  . albuterol (PROVENTIL HFA;VENTOLIN HFA) 108 (90 Base) MCG/ACT inhaler Inhale 1-2 puffs into the lungs every 6 (six) hours as needed for wheezing or shortness of breath. 1 Inhaler 0  . levothyroxine (SYNTHROID) 75 MCG tablet Take 1 tablet (75 mcg total) by mouth daily. 90 tablet 1  . Multiple Vitamin (MULTIVITAMIN) tablet Take 1 tablet by mouth daily as  needed.     . triamcinolone cream (KENALOG) 0.1 % Apply 1 application topically 2 (two) times daily. Apply a small amount to the affected area 30 g 0   No facility-administered medications prior to visit.     Per HPI unless specifically indicated in ROS section below Review of Systems Objective:    BP 120/78 (BP Location: Left Arm, Patient Position: Sitting, Cuff Size: Normal)   Pulse 69   Temp 98.5 F (36.9 C) (Temporal)   Ht 6\' 2"  (1.88 m)   Wt 221 lb 2 oz (100.3 kg)   SpO2 98%   BMI 28.39 kg/m   Wt Readings from Last 3 Encounters:  05/22/19 221 lb 2 oz (100.3 kg)  01/23/19 213 lb 8 oz (96.8 kg)  01/04/19 210 lb (95.3 kg)    Physical Exam Vitals and nursing note reviewed.  Constitutional:      Appearance: Normal appearance. He is not ill-appearing.  HENT:     Head: Normocephalic and atraumatic.     Right Ear: Tympanic membrane, ear canal and external ear normal. There is no impacted cerumen.     Left Ear: Tympanic membrane, ear canal and external ear normal. There is no impacted cerumen.     Mouth/Throat:     Mouth: Mucous membranes are moist.     Pharynx: Oropharynx is clear. No posterior oropharyngeal erythema.  Eyes:  Extraocular Movements: Extraocular movements intact.     Conjunctiva/sclera: Conjunctivae normal.     Pupils: Pupils are equal, round, and reactive to light.  Neck:     Vascular: No carotid bruit.  Cardiovascular:     Rate and Rhythm: Normal rate and regular rhythm.     Pulses: Normal pulses.     Heart sounds: Normal heart sounds. No murmur.  Pulmonary:     Effort: Pulmonary effort is normal. No respiratory distress.     Breath sounds: Normal breath sounds. No wheezing, rhonchi or rales.  Skin:    General: Skin is warm and dry.     Capillary Refill: Capillary refill takes less than 2 seconds.     Findings: No erythema or rash.  Neurological:     General: No focal deficit present.     Mental Status: He is alert.     Cranial Nerves: Cranial  nerves are intact.     Sensory: Sensation is intact.     Motor: Motor function is intact.     Coordination: Coordination is intact. Romberg sign negative. Coordination normal. Finger-Nose-Finger Test normal.     Gait: Gait is intact.     Comments:  CN 2-12 intact FTN intact EOMI No pronator drift, neg romberg DixHallpike negative bilaterally - but mild dizziness with sitting up from supine position Neg tinel/phalen bilaterally  Psychiatric:        Mood and Affect: Mood normal.        Behavior: Behavior normal.       Results for orders placed or performed in visit on 01/19/19  TSH  Result Value Ref Range   TSH 2.57 0.35 - 4.50 uIU/mL   Assessment & Plan:  This visit occurred during the SARS-CoV-2 public health emergency.  Safety protocols were in place, including screening questions prior to the visit, additional usage of staff PPE, and extensive cleaning of exam room while observing appropriate contact time as indicated for disinfecting solutions.   Problem List Items Addressed This Visit    Left hand pain    Not consistent with CTS. Reassuring exam. ?residual nerve irritation from prior shingles infection.       Hypothyroidism    Well controlled based on TSH earlier this year      Dizziness - Primary    Anticipate he experienced brief episodes of BPPV. Discussed etiology and treatment with patient. Provided with modified epley's maneuvers to perform at home. Did have reassuring nonfocal neurological exam. Update if persistent symptoms. BPPV handout provided today.  Orthostatics negative. Not consistent with stroke.           No orders of the defined types were placed in this encounter.  No orders of the defined types were placed in this encounter.  Patient instructions: I think you had episode of benign positional vertigo. Do repositioning maneuvers provided today.  Let us know if not improving with this.   Follow up plan: Return if symptoms worsen or fail to  improve.  Ria Bush, MD

## 2019-05-23 DIAGNOSIS — M79642 Pain in left hand: Secondary | ICD-10-CM | POA: Insufficient documentation

## 2019-05-23 DIAGNOSIS — R42 Dizziness and giddiness: Secondary | ICD-10-CM | POA: Insufficient documentation

## 2019-05-23 NOTE — Assessment & Plan Note (Addendum)
Not consistent with CTS. Reassuring exam. ?residual nerve irritation from prior shingles infection.

## 2019-05-23 NOTE — Assessment & Plan Note (Addendum)
Anticipate he experienced brief episodes of BPPV. Discussed etiology and treatment with patient. Provided with modified epley's maneuvers to perform at home. Did have reassuring nonfocal neurological exam. Update if persistent symptoms. BPPV handout provided today.  Orthostatics negative. Not consistent with stroke.

## 2019-05-23 NOTE — Assessment & Plan Note (Addendum)
Well controlled based on TSH earlier this year

## 2019-06-15 DIAGNOSIS — H40013 Open angle with borderline findings, low risk, bilateral: Secondary | ICD-10-CM | POA: Diagnosis not present

## 2019-07-08 ENCOUNTER — Other Ambulatory Visit: Payer: Self-pay | Admitting: Family Medicine

## 2019-08-06 DIAGNOSIS — Z20822 Contact with and (suspected) exposure to covid-19: Secondary | ICD-10-CM | POA: Diagnosis not present

## 2019-08-06 DIAGNOSIS — R0981 Nasal congestion: Secondary | ICD-10-CM | POA: Diagnosis not present

## 2019-08-06 DIAGNOSIS — J00 Acute nasopharyngitis [common cold]: Secondary | ICD-10-CM | POA: Diagnosis not present

## 2019-08-06 DIAGNOSIS — R0982 Postnasal drip: Secondary | ICD-10-CM | POA: Diagnosis not present

## 2019-12-28 ENCOUNTER — Other Ambulatory Visit: Payer: Self-pay | Admitting: Family Medicine

## 2020-01-17 ENCOUNTER — Encounter: Payer: Self-pay | Admitting: Family Medicine

## 2020-01-25 ENCOUNTER — Encounter: Payer: Self-pay | Admitting: Family Medicine

## 2020-03-21 ENCOUNTER — Telehealth: Payer: Self-pay | Admitting: Family Medicine

## 2020-03-21 NOTE — Telephone Encounter (Signed)
Last office visit 05/22/2019 for dizziness.  Last TSH 01/19/2019. No future appointments.  Refill?

## 2020-03-21 NOTE — Telephone Encounter (Signed)
Sent. Please schedule yearly visit when possible.  Thanks.   

## 2020-03-22 NOTE — Telephone Encounter (Signed)
Please call patient and schedule appointment as instructed. 

## 2020-03-24 NOTE — Telephone Encounter (Signed)
Pt has been scheduled.  °

## 2020-05-09 ENCOUNTER — Other Ambulatory Visit: Payer: Self-pay

## 2020-05-09 ENCOUNTER — Ambulatory Visit (INDEPENDENT_AMBULATORY_CARE_PROVIDER_SITE_OTHER): Payer: BC Managed Care – PPO | Admitting: Family Medicine

## 2020-05-09 ENCOUNTER — Encounter: Payer: Self-pay | Admitting: Family Medicine

## 2020-05-09 VITALS — BP 128/70 | HR 64 | Temp 97.6°F | Ht 74.0 in | Wt 227.8 lb

## 2020-05-09 DIAGNOSIS — Z0000 Encounter for general adult medical examination without abnormal findings: Secondary | ICD-10-CM | POA: Diagnosis not present

## 2020-05-09 DIAGNOSIS — E039 Hypothyroidism, unspecified: Secondary | ICD-10-CM | POA: Diagnosis not present

## 2020-05-09 DIAGNOSIS — Z23 Encounter for immunization: Secondary | ICD-10-CM | POA: Diagnosis not present

## 2020-05-09 DIAGNOSIS — Z9109 Other allergy status, other than to drugs and biological substances: Secondary | ICD-10-CM

## 2020-05-09 DIAGNOSIS — E785 Hyperlipidemia, unspecified: Secondary | ICD-10-CM

## 2020-05-09 DIAGNOSIS — K219 Gastro-esophageal reflux disease without esophagitis: Secondary | ICD-10-CM

## 2020-05-09 DIAGNOSIS — Z7189 Other specified counseling: Secondary | ICD-10-CM

## 2020-05-09 LAB — TSH: TSH: 1.79 u[IU]/mL (ref 0.35–4.50)

## 2020-05-09 LAB — LIPID PANEL
Cholesterol: 202 mg/dL — ABNORMAL HIGH (ref 0–200)
HDL: 36.9 mg/dL — ABNORMAL LOW (ref >39.00)
NonHDL: 165.53
Total CHOL/HDL Ratio: 5
Triglycerides: 315 mg/dL — ABNORMAL HIGH (ref 0.0–149.0)
VLDL: 63 mg/dL — ABNORMAL HIGH (ref 0.0–40.0)

## 2020-05-09 LAB — LDL CHOLESTEROL, DIRECT: Direct LDL: 127 mg/dL

## 2020-05-09 MED ORDER — FLUTICASONE PROPIONATE 50 MCG/ACT NA SUSP: 2.0000 | Freq: Every day | NASAL | Status: DC

## 2020-05-09 NOTE — Patient Instructions (Signed)
Go to the lab on the way out.   If you have mychart we'll likely use that to update you.    °Take care.  Glad to see you. °Update me as needed.   °

## 2020-05-09 NOTE — Progress Notes (Signed)
This visit occurred during the SARS-CoV-2 public health emergency.  Safety protocols were in place, including screening questions prior to the visit, additional usage of staff PPE, and extensive cleaning of exam room while observing appropriate contact time as indicated for disinfecting solutions.  CPE- See plan.  Routine anticipatory guidance given to patient.  See health maintenance.  The possibility exists that previously documented standard health maintenance information may have been brought forward from a previous encounter into this note.  If needed, that same information has been updated to reflect the current situation based on today's encounter.    Tetanus 2016 Flu 2021 PNA and shingles not due.  covid vaccine prev done 2021 Prostate and colon cancer screening not due  D/w pt about diet and exercise.  Wife designated if patient were incapacitated.  [facility]    Hypothyroidism.  Compliant, no ADE on med, labs pending. No neck mass.    He has h/o seasonal allergies, now with fall sx.  He can try flonase, d/w pt.  He has carpet in his bedroom and unclear if that contributes but it is possible.    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  D/w pt.  This was a sig stressor.  He has had some mild chest discomfort at the time but not exertional sx.  Sx are better with exertion.  We talked about managing stressors, sleep and diet/exercise.  He'll update me as needed.    He isn't fasting, noted regarding his labs.  PMH and SH reviewed  Meds, vitals, and allergies reviewed.   ROS: Per HPI.  Unless specifically indicated otherwise in HPI, the patient denies:  General: fever. Eyes: acute vision changes ENT: sore throat Cardiovascular: chest pain Respiratory: SOB GI: vomiting GU: dysuria Musculoskeletal: acute back pain Derm: acute rash Neuro: acute motor  dysfunction Psych: worsening mood Endocrine: polydipsia Heme: bleeding Allergy: hayfever  GEN: nad, alert and oriented HEENT: ncat NECK: supple w/o LA CV: rrr. PULM: ctab, no inc wob ABD: soft, +bs EXT: no edema SKIN: no acute rash

## 2020-05-11 MED ORDER — LEVOTHYROXINE SODIUM 75 MCG PO TABS
ORAL_TABLET | ORAL | 3 refills | Status: DC
Start: 2020-05-11 — End: 2021-06-05

## 2020-05-11 NOTE — Assessment & Plan Note (Signed)
Wife designated if patient were incapacitated.  

## 2020-05-11 NOTE — Assessment & Plan Note (Signed)
Compliant, no ADE on med, labs pending. No neck mass.   Continue levothyroxine as is for now.  See notes on labs.

## 2020-05-11 NOTE — Assessment & Plan Note (Signed)
Tetanus 2016 Flu 2021 PNA and shingles not due.  covid vaccine prev done 2021 Prostate and colon cancer screening not due  D/w pt about diet and exercise.  Wife designated if patient were incapacitated.  [facility].

## 2020-05-11 NOTE — Assessment & Plan Note (Signed)
I suspect he had GERD related to stressors with work that is better in the meantime.  He does not have exertional symptoms and he can update me as needed.  He agrees.

## 2020-05-11 NOTE — Assessment & Plan Note (Signed)
He can try Flonase and update me as needed.  He agrees.

## 2020-06-15 DIAGNOSIS — H40013 Open angle with borderline findings, low risk, bilateral: Secondary | ICD-10-CM | POA: Diagnosis not present

## 2020-07-09 IMAGING — DX RIGHT TIBIA AND FIBULA - 2 VIEW
4 series · 4 of 4 positions shown · non-contrast
Comparison: None.

CLINICAL DATA: Fall, pain

EXAM:
RIGHT TIBIA AND FIBULA - 2 VIEW

[tibia ap (1 of 2)]
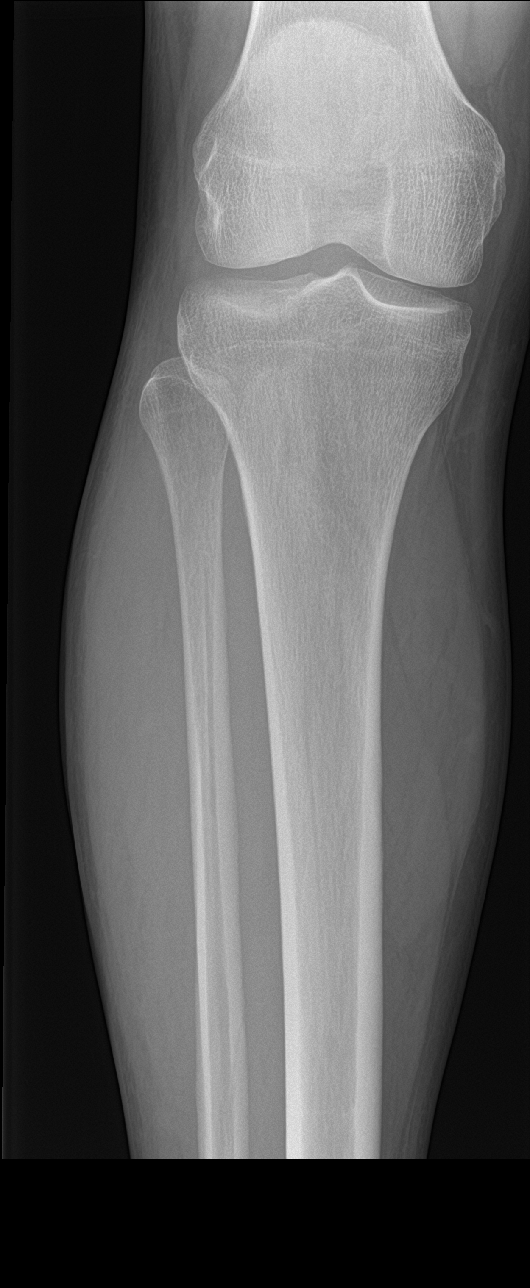

[tibia ap (2 of 2)]
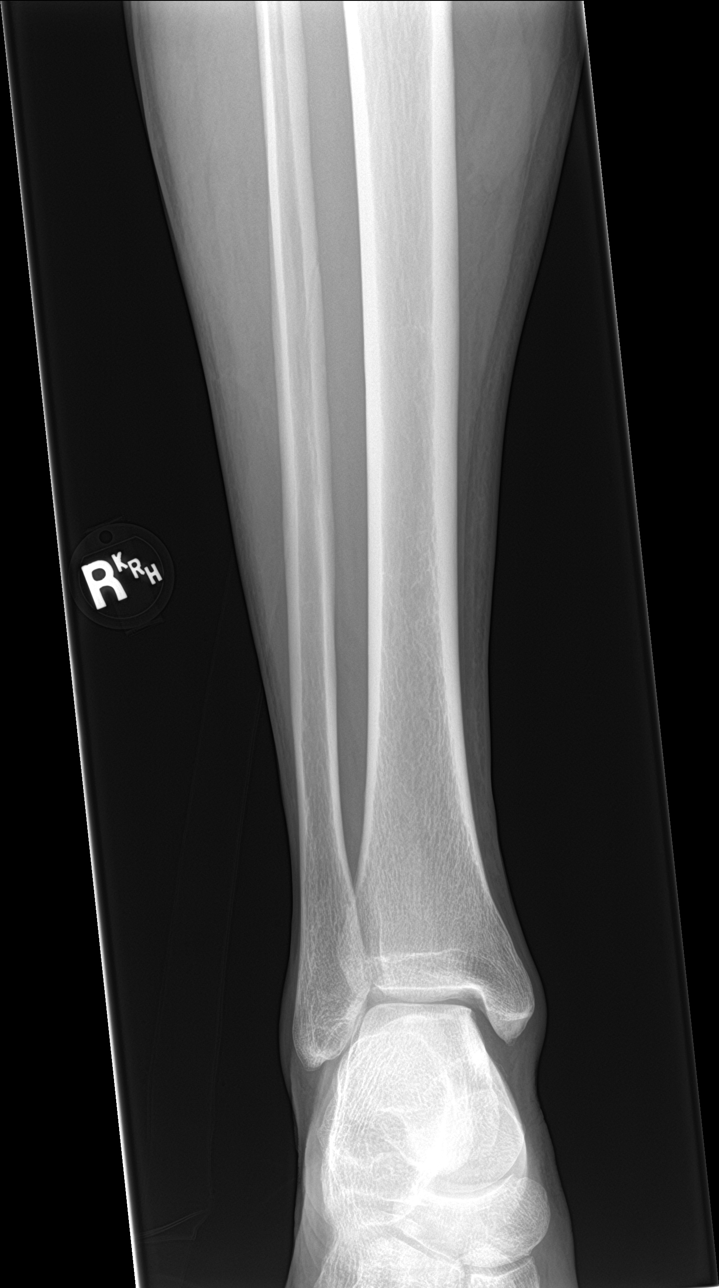

[tibia lat (1 of 2)]
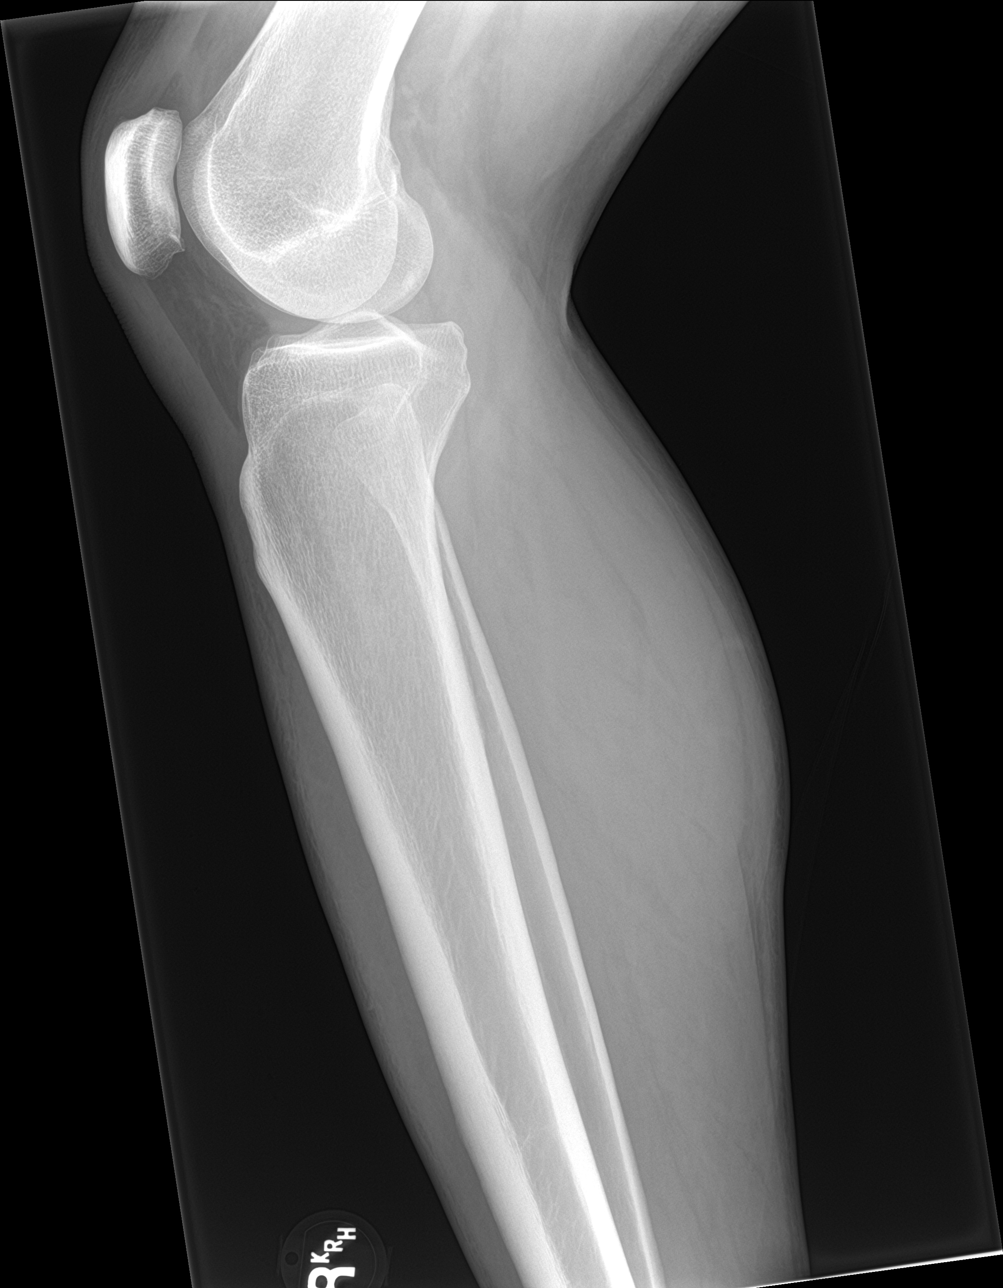

[tibia lat (2 of 2)]
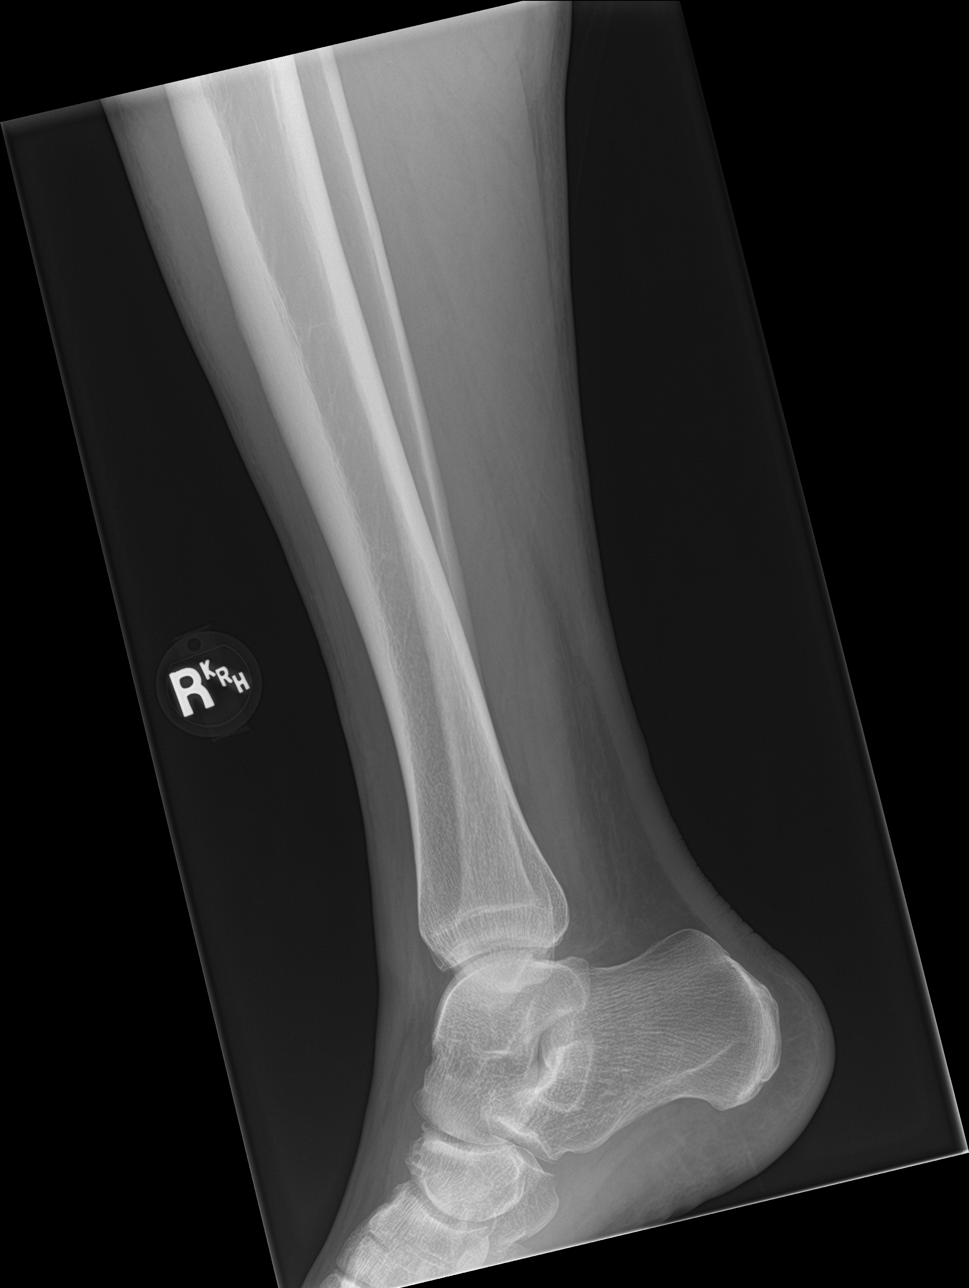

[4 of 4 positions shown; findings below may reference images not displayed]

FINDINGS: No definite fracture or dislocation of the right tibia or fibula.
There is an oblique lucency of the mid diaphysis of the right
fibula, likely a vascular nutrient foramen although nondisplaced,
incomplete fracture is not excluded. The partially imaged right knee
and right ankle are unremarkable.
IMPRESSION: No definite fracture or dislocation of the right tibia or fibula.
There is an oblique lucency of the mid diaphysis of the right
fibula, likely a vascular nutrient foramen although nondisplaced,
incomplete fracture is not excluded. The partially imaged right knee
and right ankle are unremarkable.

## 2020-09-25 ENCOUNTER — Other Ambulatory Visit: Payer: Self-pay

## 2020-09-25 ENCOUNTER — Encounter (HOSPITAL_BASED_OUTPATIENT_CLINIC_OR_DEPARTMENT_OTHER): Payer: Self-pay | Admitting: Emergency Medicine

## 2020-09-25 ENCOUNTER — Emergency Department (HOSPITAL_BASED_OUTPATIENT_CLINIC_OR_DEPARTMENT_OTHER): Payer: BC Managed Care – PPO

## 2020-09-25 ENCOUNTER — Emergency Department (HOSPITAL_BASED_OUTPATIENT_CLINIC_OR_DEPARTMENT_OTHER)
Admission: EM | Admit: 2020-09-25 | Discharge: 2020-09-25 | Disposition: A | Discharge status: Home / Self Care | Payer: BC Managed Care – PPO | Attending: Emergency Medicine | Admitting: Emergency Medicine

## 2020-09-25 DIAGNOSIS — E039 Hypothyroidism, unspecified: Secondary | ICD-10-CM | POA: Diagnosis not present

## 2020-09-25 DIAGNOSIS — B349 Viral infection, unspecified: Secondary | ICD-10-CM

## 2020-09-25 DIAGNOSIS — R0789 Other chest pain: Secondary | ICD-10-CM | POA: Diagnosis not present

## 2020-09-25 DIAGNOSIS — Z87891 Personal history of nicotine dependence: Secondary | ICD-10-CM | POA: Insufficient documentation

## 2020-09-25 DIAGNOSIS — R7401 Elevation of levels of liver transaminase levels: Secondary | ICD-10-CM | POA: Insufficient documentation

## 2020-09-25 DIAGNOSIS — Z79899 Other long term (current) drug therapy: Secondary | ICD-10-CM | POA: Diagnosis not present

## 2020-09-25 DIAGNOSIS — R519 Headache, unspecified: Secondary | ICD-10-CM | POA: Diagnosis not present

## 2020-09-25 DIAGNOSIS — R079 Chest pain, unspecified: Secondary | ICD-10-CM | POA: Diagnosis not present

## 2020-09-25 DIAGNOSIS — R509 Fever, unspecified: Secondary | ICD-10-CM

## 2020-09-25 DIAGNOSIS — Z20822 Contact with and (suspected) exposure to covid-19: Secondary | ICD-10-CM | POA: Diagnosis not present

## 2020-09-25 DIAGNOSIS — R5383 Other fatigue: Secondary | ICD-10-CM | POA: Diagnosis not present

## 2020-09-25 LAB — CBC WITH DIFFERENTIAL/PLATELET
Abs Immature Granulocytes: 0.03 10*3/uL (ref 0.00–0.07)
Basophils Absolute: 0 10*3/uL (ref 0.0–0.1)
Basophils Relative: 0 %
Eosinophils Absolute: 0 10*3/uL (ref 0.0–0.5)
Eosinophils Relative: 0 %
HCT: 41.1 % (ref 39.0–52.0)
Hemoglobin: 14.6 g/dL (ref 13.0–17.0)
Immature Granulocytes: 0 %
Lymphocytes Relative: 22 %
Lymphs Abs: 2 10*3/uL (ref 0.7–4.0)
MCH: 30.4 pg (ref 26.0–34.0)
MCHC: 35.5 g/dL (ref 30.0–36.0)
MCV: 85.4 fL (ref 80.0–100.0)
Monocytes Absolute: 1.1 10*3/uL — ABNORMAL HIGH (ref 0.1–1.0)
Monocytes Relative: 12 %
Neutro Abs: 6 10*3/uL (ref 1.7–7.7)
Neutrophils Relative %: 66 %
Platelets: 204 10*3/uL (ref 150–400)
RBC: 4.81 MIL/uL (ref 4.22–5.81)
RDW: 12.9 % (ref 11.5–15.5)
WBC: 9.2 10*3/uL (ref 4.0–10.5)
nRBC: 0 % (ref 0.0–0.2)

## 2020-09-25 LAB — COMPREHENSIVE METABOLIC PANEL
ALT: 143 U/L — ABNORMAL HIGH (ref 0–44)
AST: 77 U/L — ABNORMAL HIGH (ref 15–41)
Albumin: 4 g/dL (ref 3.5–5.0)
Alkaline Phosphatase: 95 U/L (ref 38–126)
Anion gap: 13 (ref 5–15)
BUN: 7 mg/dL (ref 6–20)
CO2: 22 mmol/L (ref 22–32)
Calcium: 8.9 mg/dL (ref 8.9–10.3)
Chloride: 102 mmol/L (ref 98–111)
Creatinine, Ser: 0.84 mg/dL (ref 0.61–1.24)
GFR, Estimated: >60 mL/min (ref >60)
Glucose, Bld: 117 mg/dL — ABNORMAL HIGH (ref 70–99)
Potassium: 3 mmol/L — ABNORMAL LOW (ref 3.5–5.1)
Sodium: 137 mmol/L (ref 135–145)
Total Bilirubin: 0.6 mg/dL (ref 0.3–1.2)
Total Protein: 7.8 g/dL (ref 6.5–8.1)

## 2020-09-25 LAB — RESP PANEL BY RT-PCR (FLU A&B, COVID) ARPGX2
Influenza A by PCR: NEGATIVE
Influenza B by PCR: NEGATIVE
SARS Coronavirus 2 by RT PCR: NEGATIVE

## 2020-09-25 LAB — TROPONIN I (HIGH SENSITIVITY): Troponin I (High Sensitivity): 4 ng/L (ref <18)

## 2020-09-25 NOTE — ED Provider Notes (Signed)
[facility] EMERGENCY DEPARTMENT Provider Note   CSN: [id number] Arrival date & time: 09/25/20  1856     History Chief Complaint  Patient presents with  . Weakness    Larry Wilkinson is a 40 y.o. male.  HPI      40yo male with history of high triglycerides, hypothyroidism who presents with concern for chills, fever, headache and chest pain.  Sypmtoms began Thursday. Has had chills, a few times full shaking/rigors, attempting to take warm showers to try to warm up.  Chest pain also began Thursday, pressure like pain. Took pepcid with improvement.  Denies current chest pain.  No associated dyspnea. No asymmetric leg swelling or pain, recent surgeries or immobilization.  Denies cough, congestion, sore throat, urinary symptoms, rash, IVDU, tick bites.  Fever began Thursday, up to 101, this AM woke with diaphoresis and temperature 96.  Has headache, did have neck pain yesterday, reports some back pain. No known sick contacts.  Past Medical History:  Diagnosis Date  . Ankle fracture, right 1996   ?; not sure if it was a break or growth plate separation  . Anxiety    with stress  . Chronic tension headaches   . High triglycerides   . Hypothyroid   . Nonspecific abnormal results of liver function study   . Right arm fracture 1988  . Right shoulder injury 2006   no surgery; no fracture  . Sprain of ankle, unspecified site    Left    Patient Active Problem List   Diagnosis Date Noted  . Dizziness 05/23/2019  . Left hand pain 05/23/2019  . Fall at home 01/25/2019  . Routine general medical examination at a health care facility 10/23/2017  . GERD (gastroesophageal reflux disease) 01/18/2016  . Advance care planning 01/21/2015  . Environmental allergies 09/10/2013  . Skin lesion 09/10/2013  . Hypothyroidism 04/21/2007    Past Surgical History:  Procedure Laterality Date  . HERNIA REPAIR  1987   bilateral  . WISDOM TOOTH EXTRACTION  2004       Family History   Problem Relation Age of Onset  . Healthy Mother   . Healthy Father   . Healthy Sister        halfsister  . Healthy Sister        halfsister  . Healthy Brother        halfbrother  . Healthy Brother        halfbrother  . Colon cancer Neg Hx   . Prostate cancer Neg Hx     Social History   Tobacco Use  . Smoking status: Former Games developer  . Smokeless tobacco: Never Used  Substance Use Topics  . Alcohol use: Yes    Comment: very rare  . Drug use: No    Home Medications Prior to Admission medications   Medication Sig Start Date End Date Taking? Authorizing Provider  famotidine (PEPCID) 20 MG tablet Take 20 mg by mouth as directed. OTC Pepcid AC   Yes [provider]  levothyroxine (SYNTHROID) 75 MCG tablet TAKE 1 TABLET BY MOUTH DAILY. 05/11/20  Yes Joaquim Nam, MD  albuterol (PROVENTIL HFA;VENTOLIN HFA) 108 (90 Base) MCG/ACT inhaler Inhale 1-2 puffs into the lungs every 6 (six) hours as needed for wheezing or shortness of breath. 09/25/16   Joaquim Nam, MD  fluticasone Aleda Grana) 50 MCG/ACT nasal spray Place 2 sprays into both nostrils daily. 05/09/20   Joaquim Nam, MD  Multiple Vitamin (MULTIVITAMIN) tablet Take 1  tablet by mouth daily as needed.     [provider]  triamcinolone cream (KENALOG) 0.1 % Apply 1 application topically 2 (two) times daily. Apply a small amount to the affected area 05/01/19   Joaquim Nam, MD    Allergies    Allegra [fexofenadine]  Review of Systems   Review of Systems  Constitutional: Positive for chills, fatigue and fever.  HENT: Negative for congestion and sore throat.   Eyes: Negative for visual disturbance.  Respiratory: Negative for cough and shortness of breath.   Cardiovascular: Positive for chest pain. Negative for leg swelling.  Gastrointestinal: Negative for abdominal pain, nausea and vomiting.  Genitourinary: Negative for difficulty urinating and dysuria.  Musculoskeletal: Negative for back pain and  neck stiffness.  Skin: Negative for rash.  Neurological: Positive for headaches. Negative for syncope, weakness and numbness.    Physical Exam Updated Vital Signs BP (!) 125/97 (BP Location: Left Arm)   Pulse 82   Temp 98.4 F (36.9 C) (Oral)   Resp 10   SpO2 100%   Physical Exam Vitals and nursing note reviewed.  Constitutional:      General: He is not in acute distress.    Appearance: He is well-developed. He is not diaphoretic.  HENT:     Head: Normocephalic and atraumatic.  Eyes:     Conjunctiva/sclera: Conjunctivae normal.  Cardiovascular:     Rate and Rhythm: Normal rate and regular rhythm.     Heart sounds: Normal heart sounds. No murmur heard. No friction rub. No gallop.   Pulmonary:     Effort: Pulmonary effort is normal. No respiratory distress.     Breath sounds: Normal breath sounds. No wheezing or rales.  Abdominal:     General: There is no distension.     Palpations: Abdomen is soft.     Tenderness: There is no abdominal tenderness. There is no guarding.  Musculoskeletal:     Cervical back: Normal range of motion. No rigidity.  Skin:    General: Skin is warm and dry.  Neurological:     Mental Status: He is alert and oriented to person, place, and time.     ED Results / Procedures / Treatments   Labs (all labs ordered are listed, but only abnormal results are displayed) Labs Reviewed  CBC WITH DIFFERENTIAL/PLATELET - Abnormal; Notable for the following components:      Result Value   Monocytes Absolute 1.1 (*)    All other components within normal limits  COMPREHENSIVE METABOLIC PANEL - Abnormal; Notable for the following components:   Potassium 3.0 (*)    Glucose, Bld 117 (*)    AST 77 (*)    ALT 143 (*)    All other components within normal limits  RESP PANEL BY RT-PCR (FLU A&B, COVID) [id number]  TROPONIN I (HIGH SENSITIVITY)  TROPONIN I (HIGH SENSITIVITY)    EKG EKG Interpretation  Date/Time:  Sunday September 25 2020 08:58:26 EDT Ventricular  Rate:  79 PR Interval:  145 QRS Duration: 108 QT Interval:  363 QTC Calculation: 417 R Axis:   57 Text Interpretation: Sinus rhythm Abnormal R-wave progression, early transition Borderline T wave abnormalities No significant change since last tracing Confirmed by Alvira Monday ([id number]) on 09/25/2020 9:12:22 AM   Radiology DG Chest Portable 1 View  Result Date: 09/25/2020 CLINICAL DATA:  Fever, fatigue, chest pain. EXAM: PORTABLE CHEST 1 VIEW COMPARISON:  Chest x-ray dated 06/11/2011 FINDINGS: Heart size and mediastinal contours are normal. Stable small benign calcified  granuloma within the LEFT lower lung. Lungs otherwise clear. No pleural effusion or pneumothorax is seen. Osseous structures about the chest are unremarkable. IMPRESSION: No active disease. No evidence of pneumonia or pulmonary edema. Electronically Signed   By: Bary Richard M.D.   On: 09/25/2020 09:20    Procedures Procedures   Medications Ordered in ED Medications - No data to display  ED Course  I have reviewed the triage vital signs and the nursing notes.  Pertinent labs & imaging results that were available during my care of the patient were reviewed by me and considered in my medical decision making (see chart for details).    MDM Rules/Calculators/A&P                          40yo male with history of high triglycerides, hypothyroidism who presents with concern for chills, fever, headache and chest pain. EKG without findings to suggest ACS, pericarditis. XR without signs of pneumonia, pulmonary edema or pneumothorax.  Labs without leukocytosis, AKI. Mild elevation transaminases noted previously, no RUQ pain or tenderness, doubt cholecystitis, recommend PCP follow up.  Headache present since Thursday, normal ROM and no neck rigidity, normal mentation, given duration of symptoms and exam have low suspicion for bacterial meningitis. Not have neurologic symptoms, no trauma, doubt ICH.  Troponin negative, doubt ACS  given duration of symptoms and negative troponin. No dyspnea, no hypoxia, no asymmetric swelling or PE risk factors, given fever up to 101 low suspicion for PE. Suspect likely viral etiology of symptoms given fever, chills. COVID, flu testing ordered and pending. Recommend continued supportive care, PCP follow up. Patient discharged in stable condition with understanding of reasons to return.     Final Clinical Impression(s) / ED Diagnoses Final diagnoses:  Viral syndrome  Fever, unspecified fever cause  Acute nonintractable headache, unspecified headache type  Chest pain, unspecified type    Rx / DC Orders ED Discharge Orders    None       Alvira Monday, MD 09/25/20 2214

## 2020-09-25 NOTE — ED Triage Notes (Addendum)
States since Thursday he has been having chills, low grade fever and chest pain. Chest pain has been relieved when taking Pepcid AC. Denies CP at present. Also states," I am under so much stress and I feel so anxious"

## 2020-09-25 NOTE — ED Notes (Signed)
Pt reports starting in October having stressful moments at work to where he cannot sleep because he is up wondering if he completed everything he needed to do for work that day since having a ransom ware atttack. Pt reports still has not recovered from that situation mentally. Pt reports feeling anxious

## 2020-10-06 ENCOUNTER — Encounter: Payer: Self-pay | Admitting: Family Medicine

## 2020-10-06 ENCOUNTER — Ambulatory Visit: Payer: BC Managed Care – PPO | Admitting: Family Medicine

## 2020-10-06 ENCOUNTER — Other Ambulatory Visit: Payer: Self-pay

## 2020-10-06 VITALS — BP 112/80 | HR 71 | Temp 97.3°F | Ht 74.0 in | Wt 219.0 lb

## 2020-10-06 DIAGNOSIS — R7989 Other specified abnormal findings of blood chemistry: Secondary | ICD-10-CM

## 2020-10-06 LAB — COMPREHENSIVE METABOLIC PANEL
ALT: 16 U/L (ref 0–53)
AST: 15 U/L (ref 0–37)
Albumin: 4.4 g/dL (ref 3.5–5.2)
Alkaline Phosphatase: 74 U/L (ref 39–117)
BUN: 11 mg/dL (ref 6–23)
CO2: 29 mEq/L (ref 19–32)
Calcium: 9.1 mg/dL (ref 8.4–10.5)
Chloride: 103 mEq/L (ref 96–112)
Creatinine, Ser: 0.89 mg/dL (ref 0.40–1.50)
GFR: 106.75 mL/min (ref >60.00)
Glucose, Bld: 89 mg/dL (ref 70–99)
Potassium: 3.9 mEq/L (ref 3.5–5.1)
Sodium: 138 mEq/L (ref 135–145)
Total Bilirubin: 0.4 mg/dL (ref 0.2–1.2)
Total Protein: 7.8 g/dL (ref 6.0–8.3)

## 2020-10-06 NOTE — Progress Notes (Signed)
This visit occurred during the SARS-CoV-2 public health emergency.  Safety protocols were in place, including screening questions prior to the visit, additional usage of staff PPE, and extensive cleaning of exam room while observing appropriate contact time as indicated for disinfecting solutions.  Here for ER follow up.  He wasn't feeling well, went home from work.  That was atypical for patient.  Then had chills and fevers.  Temp up to 101.  Had a HA.  No cough, no ST.  Was taking tylenol and advil, alternating.  4/17 "woke up drenched" with weight loss noted and went to ER.  Was lightheaded.  Had some indigestion that got better with pepcid, stopped med and did well in the meantime.    Low K and LFT elevation at ER.  Recheck labs pending.    He was able to get back to work and has felt better in the meantime.  He cut out coffee cold [place] 1 month ago.    No FCANVD.  No others were sick.  No jaundice.    Meds, vitals, and allergies reviewed.   ROS: Per HPI unless specifically indicated in ROS section   GEN: nad, alert and oriented HEENT: ncat NECK: supple w/o LA CV: rrr.  PULM: ctab, no inc wob ABD: soft, +bs EXT: no edema SKIN: Well-perfused

## 2020-10-06 NOTE — Patient Instructions (Signed)
Go to the lab on the way out.   If you have mychart we'll likely use that to update you.    Take care.  Glad to see you. 

## 2020-10-09 DIAGNOSIS — R7989 Other specified abnormal findings of blood chemistry: Secondary | ICD-10-CM | POA: Insufficient documentation

## 2020-10-09 NOTE — Assessment & Plan Note (Signed)
No jaundice.  Recheck labs pending.  See notes on labs.  Likely from previous and self resolved mild viral infection.  Discussed with patient.  He will update me as needed.  He clearly feels better in the meantime.

## 2021-06-05 ENCOUNTER — Telehealth: Payer: Self-pay | Admitting: Family Medicine

## 2021-06-05 NOTE — Telephone Encounter (Signed)
Patient due for CPE; please call to schedule when possible

## 2021-06-06 NOTE — Telephone Encounter (Signed)
Pt scheduled a cpe/lab in march2023 

## 2021-06-08 ENCOUNTER — Telehealth: Payer: BC Managed Care – PPO | Admitting: Family

## 2021-06-08 ENCOUNTER — Other Ambulatory Visit: Payer: Self-pay

## 2021-06-19 DIAGNOSIS — H40013 Open angle with borderline findings, low risk, bilateral: Secondary | ICD-10-CM | POA: Diagnosis not present

## 2021-07-30 ENCOUNTER — Other Ambulatory Visit: Payer: Self-pay | Admitting: Family Medicine

## 2021-07-30 DIAGNOSIS — E039 Hypothyroidism, unspecified: Secondary | ICD-10-CM

## 2021-07-30 DIAGNOSIS — E785 Hyperlipidemia, unspecified: Secondary | ICD-10-CM

## 2021-08-08 ENCOUNTER — Other Ambulatory Visit: Payer: Self-pay

## 2021-08-08 ENCOUNTER — Other Ambulatory Visit (INDEPENDENT_AMBULATORY_CARE_PROVIDER_SITE_OTHER): Payer: BC Managed Care – PPO

## 2021-08-08 DIAGNOSIS — E785 Hyperlipidemia, unspecified: Secondary | ICD-10-CM

## 2021-08-08 DIAGNOSIS — E039 Hypothyroidism, unspecified: Secondary | ICD-10-CM

## 2021-08-08 LAB — LIPID PANEL
Cholesterol: 198 mg/dL (ref 0–200)
HDL: 35.5 mg/dL — ABNORMAL LOW (ref >39.00)
LDL Cholesterol: 132 mg/dL — ABNORMAL HIGH (ref 0–99)
NonHDL: 162.94
Total CHOL/HDL Ratio: 6
Triglycerides: 154 mg/dL — ABNORMAL HIGH (ref 0.0–149.0)
VLDL: 30.8 mg/dL (ref 0.0–40.0)

## 2021-08-08 LAB — COMPREHENSIVE METABOLIC PANEL
ALT: 27 U/L (ref 0–53)
AST: 22 U/L (ref 0–37)
Albumin: 4.5 g/dL (ref 3.5–5.2)
Alkaline Phosphatase: 71 U/L (ref 39–117)
BUN: 12 mg/dL (ref 6–23)
CO2: 27 mEq/L (ref 19–32)
Calcium: 9.1 mg/dL (ref 8.4–10.5)
Chloride: 104 mEq/L (ref 96–112)
Creatinine, Ser: 0.92 mg/dL (ref 0.40–1.50)
GFR: 103.01 mL/min (ref >60.00)
Glucose, Bld: 95 mg/dL (ref 70–99)
Potassium: 4.5 mEq/L (ref 3.5–5.1)
Sodium: 138 mEq/L (ref 135–145)
Total Bilirubin: 0.6 mg/dL (ref 0.2–1.2)
Total Protein: 7 g/dL (ref 6.0–8.3)

## 2021-08-08 LAB — TSH: TSH: 1.07 u[IU]/mL (ref 0.35–5.50)

## 2021-08-15 ENCOUNTER — Other Ambulatory Visit: Payer: Self-pay

## 2021-08-15 ENCOUNTER — Encounter: Payer: Self-pay | Admitting: Family Medicine

## 2021-08-15 ENCOUNTER — Ambulatory Visit (INDEPENDENT_AMBULATORY_CARE_PROVIDER_SITE_OTHER): Payer: BC Managed Care – PPO | Admitting: Family Medicine

## 2021-08-15 VITALS — BP 118/80 | HR 65 | Temp 97.8°F | Ht 74.0 in | Wt 233.0 lb

## 2021-08-15 DIAGNOSIS — Z0000 Encounter for general adult medical examination without abnormal findings: Secondary | ICD-10-CM

## 2021-08-15 DIAGNOSIS — Z7189 Other specified counseling: Secondary | ICD-10-CM

## 2021-08-15 DIAGNOSIS — E039 Hypothyroidism, unspecified: Secondary | ICD-10-CM

## 2021-08-15 MED ORDER — LEVOTHYROXINE SODIUM 75 MCG PO TABS: ORAL_TABLET | ORAL | 3 refills | Status: DC

## 2021-08-15 NOTE — Progress Notes (Signed)
This visit occurred during the SARS-CoV-2 public health emergency.  Safety protocols were in place, including screening questions prior to the visit, additional usage of staff PPE, and extensive cleaning of exam room while observing appropriate contact time as indicated for disinfecting solutions. ? ?CPE- See plan.  Routine anticipatory guidance given to patient.  See health maintenance.  The possibility exists that previously documented standard health maintenance information may have been brought forward from a previous encounter into this note.  If needed, that same information has been updated to reflect the current situation based on today's encounter.   ? ?Tetanus 2016 ?Flu 2022 ?PNA and shingles not due.  ?covid vaccine prev done 2021 ?Prostate and colon cancer screening not due  ?D/w pt about diet and exercise.  ?Wife designated if patient were incapacitated.  ?[facility].   ? ?He cut out coffee and chest pain resolved.   ? ?Hypothyroidism.  TSH wnl.  No neck mass, no dysphagia.  Taking med early AM, w/o food.   ? ?Son dx'd with ADHD and he is getting help.  ? ?The 10-year ASCVD risk score (Arnett DK, et al., 2019) is: 1.7% ?  Values used to calculate the score: ?    Age: 41 years ?    Sex: Male ?    Is Non-Hispanic African American: No ?    Diabetic: No ?    Tobacco smoker: No ?    Systolic Blood Pressure: 118 mmHg ?    Is BP treated: No ?    HDL Cholesterol: 35.5 mg/dL ?    Total Cholesterol: 198 mg/dL ? ?D/w pt about continued work on diet and exercise.   ? ? ?PMH and SH reviewed ? ?Meds, vitals, and allergies reviewed.  ? ?ROS: Per HPI.  Unless specifically indicated otherwise in HPI, the patient denies: ? ?General: fever. ?Eyes: acute vision changes ?ENT: sore throat ?Cardiovascular: chest pain ?Respiratory: SOB ?GI: vomiting ?GU: dysuria ?Musculoskeletal: acute back pain ?Derm: acute rash ?Neuro: acute motor dysfunction ?Psych: worsening mood ?Endocrine:  polydipsia ?Heme: bleeding ?Allergy: hayfever ? ?GEN: nad, alert and oriented ?HEENT: ncat ?NECK: supple w/o LA, no tmg ?CV: rrr. ?PULM: ctab, no inc wob ?ABD: soft, +bs ?EXT: no edema ?SKIN: no acute rash ?

## 2021-08-15 NOTE — Patient Instructions (Signed)
Update me as needed.  Thanks for your effort.  Take care.  Glad to see you. 

## 2021-08-16 NOTE — Assessment & Plan Note (Signed)
Tetanus 2016 ?Flu 2022 ?PNA and shingles not due.  ?covid vaccine prev done 2021 ?Prostate and colon cancer screening not due  ?D/w pt about diet and exercise.  ?Wife designated if patient were incapacitated.  ?[facility] HCV [facility].   ?

## 2021-08-16 NOTE — Assessment & Plan Note (Signed)
Wife designated if patient were incapacitated.  

## 2021-08-16 NOTE — Assessment & Plan Note (Signed)
Continue levothyroxine.  tsh wnl.  D/w pt.  ?

## 2021-12-14 ENCOUNTER — Encounter: Payer: Self-pay | Admitting: Family Medicine

## 2021-12-18 ENCOUNTER — Encounter: Payer: Self-pay | Admitting: Family Medicine

## 2022-03-20 ENCOUNTER — Ambulatory Visit: Payer: BC Managed Care – PPO | Admitting: Family Medicine

## 2022-03-20 ENCOUNTER — Encounter: Payer: Self-pay | Admitting: Family Medicine

## 2022-03-20 VITALS — BP 118/80 | HR 70 | Temp 98.2°F | Ht 74.0 in | Wt 234.0 lb

## 2022-03-20 DIAGNOSIS — Z23 Encounter for immunization: Secondary | ICD-10-CM | POA: Diagnosis not present

## 2022-03-20 DIAGNOSIS — L989 Disorder of the skin and subcutaneous tissue, unspecified: Secondary | ICD-10-CM

## 2022-03-20 DIAGNOSIS — M79673 Pain in unspecified foot: Secondary | ICD-10-CM | POA: Diagnosis not present

## 2022-03-20 NOTE — Progress Notes (Unsigned)
Foot lesion, L.  Longstanding, irritated and itchy.  Initially started as a blister, was present for months.  He eventually popped the lesion. Then it stayed irritated for about 1 month.  Then recently used a bandaid and neosporin in the meantime, some better now but not resolved.    Separate issue with L foot dorsal pain on walking after prolonged sitting/driving.  No R foot pain.  Then better after about 1 minute of standing and back to baseline, ie pain free at that point. He just started using arch support inserta.   Meds, vitals, and allergies reviewed.   ROS: Per HPI unless specifically indicated in ROS section   Nad L foot dorsal/medial 89mm papular lesion w/o ulceration or erythema.  Warty appearance. No telangectasia.  No ulceration.   Frozen x3 with liq N2, tolerated well, he consented for treatment.   Pes planus B with dropped MT heads.  Intact dorsalis pedis pulse and normal sensation.  Achilles medial and lateral malleolus and midfoot not tender to palpation.  Normal sensation on gross testing.

## 2022-03-20 NOTE — Patient Instructions (Signed)
Keep the spot covered.  It should blister and then heal over.  Either way, let me know.    If the arch supports don't help, then ask about seeing Dr. Lorelei Pont here.   Take care.  Glad to see you.

## 2022-03-21 DIAGNOSIS — M79673 Pain in unspecified foot: Secondary | ICD-10-CM | POA: Insufficient documentation

## 2022-03-21 NOTE — Assessment & Plan Note (Signed)
Looks like a benign warty lesion.  Frozen x3 after getting informed consent.  See above.  Tolerated well.  Post cryotherapy instructions given to patient.  He will update me after the lesion heals up.

## 2022-03-21 NOTE — Assessment & Plan Note (Signed)
He just started using arch supports. If the arch supports don't help, then I want him to ask about seeing Dr. Lorelei Pont here.

## 2022-03-31 IMAGING — DX DG CHEST 1V PORT
1 series · 1 of 1 positions shown · non-contrast
Comparison: Chest x-ray dated 06/11/2011

CLINICAL DATA: Fever, fatigue, chest pain.

EXAM:
PORTABLE CHEST 1 VIEW

[chest ap]
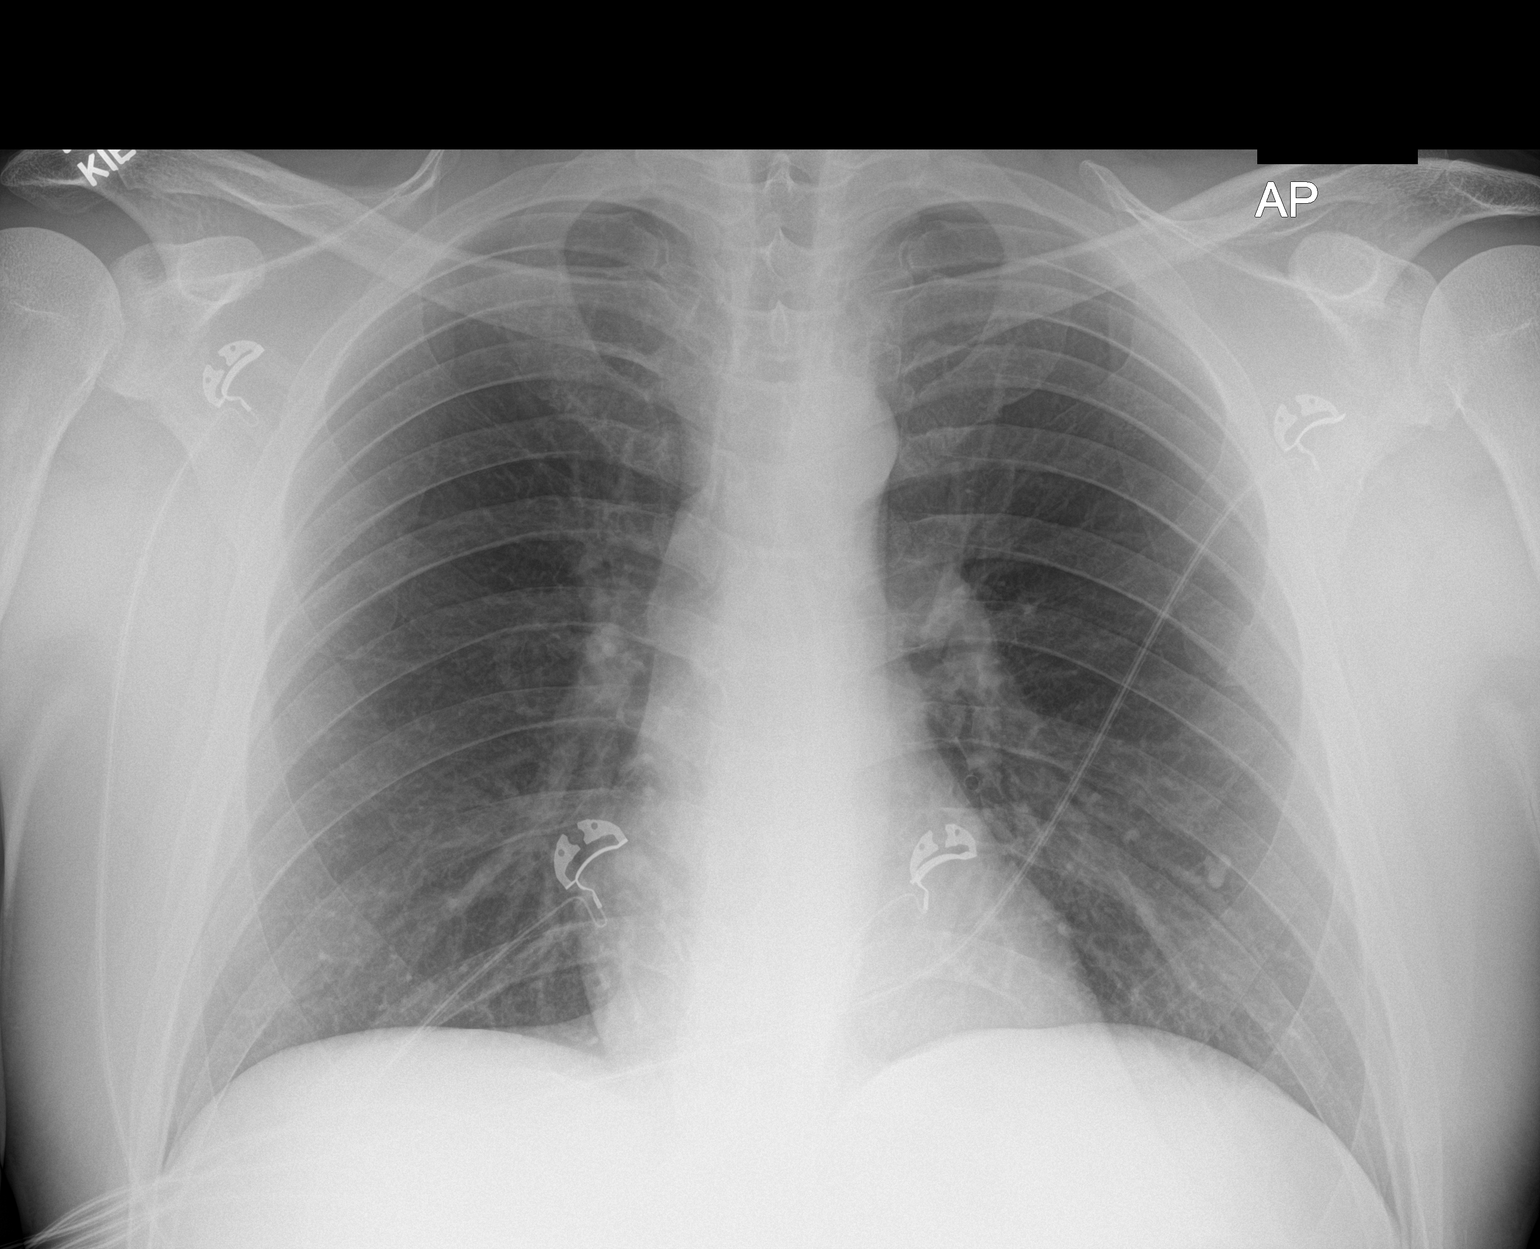

[1 of 1 positions shown; findings below may reference images not displayed]

FINDINGS: Heart size and mediastinal contours are normal. Stable small benign
calcified granuloma within the LEFT lower lung. Lungs otherwise
clear. No pleural effusion or pneumothorax is seen. Osseous
structures about the chest are unremarkable.
IMPRESSION: No active disease. No evidence of pneumonia or pulmonary edema.

## 2022-04-05 ENCOUNTER — Ambulatory Visit: Payer: BC Managed Care – PPO | Admitting: Family Medicine

## 2022-04-05 ENCOUNTER — Encounter: Payer: Self-pay | Admitting: Family Medicine

## 2022-04-05 DIAGNOSIS — L989 Disorder of the skin and subcutaneous tissue, unspecified: Secondary | ICD-10-CM

## 2022-04-05 NOTE — Patient Instructions (Signed)
It the spot doesn't heal or if new shoes don't help the pain, then let me know and we'll set you up with podiatry.

## 2022-04-05 NOTE — Progress Notes (Signed)
L foot lesion.  Prev frozen, it didn't resolve.  Itchy in the meantime. Scant discharge.  No fevers.  It gets irritated.  Not painful.  Discussed options.  He is going to get new shoes and see if that helps with his foot discomfort.  Meds, vitals, and allergies reviewed.   ROS: Per HPI unless specifically indicated in ROS section   nad L foot dorsal/medial 75mm papular lesion w/o ulceration or erythema.  Warty appearance. No telangectasia.  No ulceration.   Frozen x3 with liq N2, tolerated well, he consented for treatment.

## 2022-04-08 NOTE — Assessment & Plan Note (Addendum)
Still looks like a benign warty lesion.  Frozen x3 with liquid nitrogen and tolerated well. It the spot doesn't heal or if new shoes don't help the pain, then he can let me know and we'll set him up with podiatry.  He agrees to plan.

## 2022-04-19 ENCOUNTER — Encounter: Payer: Self-pay | Admitting: Family Medicine

## 2022-04-25 ENCOUNTER — Other Ambulatory Visit: Payer: Self-pay | Admitting: Family Medicine

## 2022-04-25 DIAGNOSIS — L989 Disorder of the skin and subcutaneous tissue, unspecified: Secondary | ICD-10-CM

## 2022-04-26 ENCOUNTER — Ambulatory Visit: Payer: BC Managed Care – PPO | Admitting: Podiatry

## 2022-04-26 DIAGNOSIS — L989 Disorder of the skin and subcutaneous tissue, unspecified: Secondary | ICD-10-CM | POA: Diagnosis not present

## 2022-04-26 NOTE — Progress Notes (Signed)
Subjective:  Patient ID: Larry Wilkinson, male    DOB: [birth year],  MRN: [id number]  Chief Complaint  Patient presents with   Foot Problem    Skin lesion on left foot. Started as a pimple and patient "popped it." PCP tried freezing it believing it was a wart.     41 y.o. male presents with concern for skin lesion on the left foot medial arch area.  He says that it started as a pimple or white raised bump that he eventually popped.  There was some clear fluid that drained out.  He then says he had it frozen twice by his primary care doctor thinking it was a wart but it did not improve.  He does not have pain at the area at this time.  But there is still some area of discoloration and dry flaking skin.  He does have an appointment to see a dermatologist for this issue coming up next week as well.  Past Medical History:  Diagnosis Date   Ankle fracture, right 1996   ?; not sure if it was a break or growth plate separation   Anxiety    with stress   Chronic tension headaches    High triglycerides    Hypothyroid    Nonspecific abnormal results of liver function study    Right arm fracture 1988   Right shoulder injury 2006   no surgery; no fracture   Sprain of ankle, unspecified site    Left    No Known Allergies  ROS: Negative except as per HPI above  Objective:  General: AAO x3, NAD  Dermatological: With inspection and palpation of the left foot there is noted to be a flat approximately 1 cm diameter circular pigmented lesion with a central pinpoint area that is darker than surrounding skin.  There is dry flaking skin overlying.  There is no ulceration present.  The lesion is nontender to palpation.  No bleeding upon debridement no evidence of verruca plantaris.  See clinical picture below  Vascular:  Dorsalis Pedis artery and Posterior Tibial artery pedal pulses are 2/4 bilateral.  Capillary fill time < 3 sec to all digits.   Neruologic: Grossly intact via light touch bilateral.  Protective threshold intact to all sites bilateral.   Musculoskeletal: No gross boney pedal deformities bilateral. No pain, crepitus, or limitation noted with foot and ankle range of motion bilateral. Muscular strength 5/5 in all groups tested bilateral.  Gait: Unassisted, Nonantalgic.      Assessment:   1. Lesion of skin of foot   2. Unknown skin lesion      Plan:  Patient was evaluated and treated and all questions answered.  #Lesion of unknown character of skin of left foot -I discussed with the patient that I am not confident in the diagnosis regarding the skin lesion he has present on the left foot. -Discussed this is most likely benign however I cannot be certain of this.  Possible that it was a spider bite that subsequently got infected or irritated the surrounding skin possibly after was frozen. -Other differential diagnosis would include basal cell carcinoma less likely melanoma. -I recommend he proceed with dermatology consult and he does have 1 coming up on Monday of next week.  Recommend he keep that appointment.  Explained they will likely want to biopsy a lesion which I would do but as he is seeing them I will defer to their expertise regarding this pigmented skin lesion. -Explained that if he does have  need for excision of the lesion and dermatology feels that I would be the most appropriate person to proceed with this I am happy to proceed with surgical excision of the lesion as needed.  Return if symptoms worsen or fail to improve.          Corinna Gab, DPM [facility] / [facility]

## 2022-04-30 DIAGNOSIS — L408 Other psoriasis: Secondary | ICD-10-CM | POA: Diagnosis not present

## 2022-04-30 DIAGNOSIS — D485 Neoplasm of uncertain behavior of skin: Secondary | ICD-10-CM | POA: Diagnosis not present

## 2022-06-20 DIAGNOSIS — H40013 Open angle with borderline findings, low risk, bilateral: Secondary | ICD-10-CM | POA: Diagnosis not present

## 2022-08-19 ENCOUNTER — Other Ambulatory Visit: Payer: Self-pay | Admitting: Family Medicine

## 2023-03-27 ENCOUNTER — Encounter (HOSPITAL_COMMUNITY): Payer: Self-pay | Admitting: *Deleted

## 2023-03-27 ENCOUNTER — Emergency Department (HOSPITAL_COMMUNITY)
Admission: EM | Admit: 2023-03-27 | Discharge: 2023-03-27 | Disposition: A | Discharge status: Home / Self Care | Payer: BC Managed Care – PPO

## 2023-03-27 ENCOUNTER — Emergency Department (HOSPITAL_COMMUNITY): Payer: BC Managed Care – PPO

## 2023-03-27 ENCOUNTER — Other Ambulatory Visit: Payer: Self-pay

## 2023-03-27 DIAGNOSIS — E039 Hypothyroidism, unspecified: Secondary | ICD-10-CM | POA: Diagnosis not present

## 2023-03-27 DIAGNOSIS — R0789 Other chest pain: Secondary | ICD-10-CM | POA: Insufficient documentation

## 2023-03-27 DIAGNOSIS — R079 Chest pain, unspecified: Secondary | ICD-10-CM

## 2023-03-27 LAB — CBC
HCT: 46.9 % (ref 39.0–52.0)
Hemoglobin: 16 g/dL (ref 13.0–17.0)
MCH: 30.1 pg (ref 26.0–34.0)
MCHC: 34.1 g/dL (ref 30.0–36.0)
MCV: 88.2 fL (ref 80.0–100.0)
Platelets: 286 10*3/uL (ref 150–400)
RBC: 5.32 MIL/uL (ref 4.22–5.81)
RDW: 13 % (ref 11.5–15.5)
WBC: 6.4 10*3/uL (ref 4.0–10.5)
nRBC: 0 % (ref 0.0–0.2)

## 2023-03-27 LAB — BASIC METABOLIC PANEL
Anion gap: 11 (ref 5–15)
BUN: 11 mg/dL (ref 6–20)
CO2: 24 mmol/L (ref 22–32)
Calcium: 8.9 mg/dL (ref 8.9–10.3)
Chloride: 102 mmol/L (ref 98–111)
Creatinine, Ser: 0.83 mg/dL (ref 0.61–1.24)
GFR, Estimated: >60 mL/min (ref >60)
Glucose, Bld: 111 mg/dL — ABNORMAL HIGH (ref 70–99)
Potassium: 3.5 mmol/L (ref 3.5–5.1)
Sodium: 137 mmol/L (ref 135–145)

## 2023-03-27 LAB — TROPONIN I (HIGH SENSITIVITY): Troponin I (High Sensitivity): 4 ng/L (ref <18)

## 2023-03-27 MED ORDER — NAPROXEN 500 MG PO TABS: 500.0000 mg | ORAL_TABLET | Freq: Two times a day (BID) | ORAL | 0 refills | Status: DC

## 2023-03-27 NOTE — ED Triage Notes (Signed)
Episode of chest pain into back over a week ago, yesterday about noon chest pain in to middle of back for a split second, left chest dull achy pain.No other symptoms

## 2023-03-27 NOTE — Discharge Instructions (Signed)
Your workup today was reassuring.  I did place a referral to cardiology.  You should receive a call in the next few days in regards to follow-up.  Take the naproxen as needed for chest discomfort.  Return to the ER for worsening symptoms.

## 2023-03-27 NOTE — ED Provider Notes (Signed)
[place] EMERGENCY DEPARTMENT AT [facility] Provider Note   CSN: [id number] Arrival date & time: 03/27/23  2536     History  Chief Complaint  Patient presents with   Chest Pain    Larry Wilkinson is a 42 y.o. male.  42 year old male with no reported past medical history other than hypothyroidism presenting to the emergency department today with chest pain.  Patient states that this began first a few weeks ago.  He had an episode where he was having pain in the left side of his thoracic region.  He states this lasted for few seconds and resolved.  He states that he was second episode yesterday and has been having soreness in the left side of his chest today.  He states the also felt like he was may be having some tingling in his left arm.  He came to the emergency department at time for further evaluation.  The patient denies any fevers, chills, or cough.  He denies a history of DVT or pulmonary embolism, recent surgeries, recent travel.  He denies any hemoptysis.  Denies any calf pain or swelling.  He denies any focal weakness, numbness, or tingling currently.  He came to the emergency department today due to these residual symptoms.  He states that he has been able to ride his dirt bike for extended periods without any chest pain or shortness of breath over the past 2 weeks.  Denies any significant family history of coronary artery disease.   Chest Pain      Home Medications Prior to Admission medications   Medication Sig Start Date End Date Taking? Authorizing Provider  naproxen (NAPROSYN) 500 MG tablet Take 1 tablet (500 mg total) by mouth 2 (two) times daily. 03/27/23  Yes Durwin Glaze, MD  levothyroxine (SYNTHROID) 75 MCG tablet TAKE 1 TABLET BY MOUTH EVERY DAY 08/20/22   Joaquim Nam, MD      Allergies    Patient has no known allergies.    Review of Systems   Review of Systems  Cardiovascular:  Positive for chest pain.  All other systems reviewed and are  negative.   Physical Exam Updated Vital Signs BP (!) 139/98   Pulse 91   Temp 97.6 F (36.4 C) (Oral)   Resp 15   Ht 6\' 2"  (1.88 m)   Wt 104.3 kg   SpO2 100%   BMI 29.53 kg/m  Physical Exam Vitals and nursing note reviewed.   Gen: NAD Eyes: PERRL, EOMI HEENT: no oropharyngeal swelling Neck: trachea midline Resp: clear to auscultation bilaterally Card: RRR, no murmurs, rubs, or gallops Abd: nontender, nondistended Extremities: no calf tenderness, no edema Vascular: 2+ radial pulses bilaterally, 2+ DP pulses bilaterally Neuro: The patient has equal strength sensation throughout the bilateral upper and lower extremities Skin: no rashes Psyc: acting appropriately   ED Results / Procedures / Treatments   Labs (all labs ordered are listed, but only abnormal results are displayed) Labs Reviewed  BASIC METABOLIC PANEL - Abnormal; Notable for the following components:      Result Value   Glucose, Bld 111 (*)    All other components within normal limits  CBC  TROPONIN I (HIGH SENSITIVITY)    EKG EKG Interpretation Date/Time:  Wednesday March 27 2023 10:00:47 EDT Ventricular Rate:  92 PR Interval:  141 QRS Duration:  100 QT Interval:  349 QTC Calculation: 432 R Axis:   49  Text Interpretation: Sinus rhythm Probable left atrial enlargement Abnormal  R-wave progression, early transition Confirmed by Beckey Downing [phone]) on 03/27/2023 10:30:31 AM  Radiology DG Chest 2 View  Result Date: 03/27/2023 CLINICAL DATA:  Acute chest pain radiating to back. EXAM: CHEST - 2 VIEW COMPARISON:  09/25/2020 FINDINGS: The heart size and mediastinal contours are within normal limits. Small calcified granuloma again seen in the left lower lung. The lungs are otherwise clear. the visualized skeletal structures are unremarkable. IMPRESSION: No active cardiopulmonary disease. Electronically Signed   By: Danae Orleans M.D.   On: 03/27/2023 11:24    Procedures Procedures    Medications  Ordered in ED Medications - No data to display  ED Course/ Medical Decision Making/ A&P                                 Medical Decision Making 42 year old male with past medical history of hypothyroidism presenting to the emergency department today with chest pain.  This been going now since yesterday.  I will further evaluate patient here with basic labs Wels and EKG, chest x-ray, and troponin for further evaluation for ACS, pulmonary edema, pulmonary infiltrates, or pneumothorax.  Smoker due to musculoskeletal pain.  The patient is PERC negative and suspicion for pulmonary embolism is low at this time.  Based on description of his symptoms and duration suspicion for aortic dissection is low given his reassuring neurovascular exam.  With the tingling in the left arm and the nature of the pain I think that if his workup is unremarkable here I will refer him to cardiology for further evaluation for cardiac chest pain but will rule out ACS here.  I will reevaluate for ultimate disposition.  The patient's EKG interpreted by me shows a sinus rhythm with a rate of 92 with normal axis, normal intervals, and nonspecific ST-T changes.  Patient's work appears reassuring.  X-ray does not show any acute findings.  EKG and troponin are negative.  I think that is stable for cardiology follow-up with return precautions.  Amount and/or Complexity of Data Reviewed Labs: ordered. Radiology: ordered.           Final Clinical Impression(s) / ED Diagnoses Final diagnoses:  Nonspecific chest pain    Rx / DC Orders ED Discharge Orders          Ordered    naproxen (NAPROSYN) 500 MG tablet  2 times daily        03/27/23 1203    Ambulatory referral to Cardiology       Comments: If you have not heard from the Cardiology office within the next 72 hours please call [phone].   03/27/23 1205              Durwin Glaze, MD 03/27/23 1206

## 2023-06-26 DIAGNOSIS — H40013 Open angle with borderline findings, low risk, bilateral: Secondary | ICD-10-CM | POA: Diagnosis not present

## 2023-08-12 ENCOUNTER — Encounter: Payer: Self-pay | Admitting: Family Medicine

## 2023-08-13 NOTE — Telephone Encounter (Signed)
 Please place lab orders for me. Thank you

## 2023-08-16 ENCOUNTER — Other Ambulatory Visit: Payer: Self-pay | Admitting: Family Medicine

## 2023-08-16 DIAGNOSIS — E039 Hypothyroidism, unspecified: Secondary | ICD-10-CM

## 2023-08-16 DIAGNOSIS — E785 Hyperlipidemia, unspecified: Secondary | ICD-10-CM

## 2023-08-16 NOTE — Telephone Encounter (Signed)
 Please check with patient about getting a lab appointment for today or Monday if possible.  Thanks.

## 2023-08-16 NOTE — Telephone Encounter (Signed)
 Patient notified and scheduled for labs on 08/19/23

## 2023-08-19 ENCOUNTER — Other Ambulatory Visit (INDEPENDENT_AMBULATORY_CARE_PROVIDER_SITE_OTHER)

## 2023-08-19 DIAGNOSIS — E785 Hyperlipidemia, unspecified: Secondary | ICD-10-CM | POA: Diagnosis not present

## 2023-08-19 DIAGNOSIS — E039 Hypothyroidism, unspecified: Secondary | ICD-10-CM | POA: Diagnosis not present

## 2023-08-19 LAB — LIPID PANEL
Cholesterol: 204 mg/dL — ABNORMAL HIGH (ref 0–200)
HDL: 42.4 mg/dL (ref >39.00)
LDL Cholesterol: 123 mg/dL — ABNORMAL HIGH (ref 0–99)
NonHDL: 161.61
Total CHOL/HDL Ratio: 5
Triglycerides: 192 mg/dL — ABNORMAL HIGH (ref 0.0–149.0)
VLDL: 38.4 mg/dL (ref 0.0–40.0)

## 2023-08-19 LAB — COMPREHENSIVE METABOLIC PANEL
ALT: 21 U/L (ref 0–53)
AST: 22 U/L (ref 0–37)
Albumin: 4.3 g/dL (ref 3.5–5.2)
Alkaline Phosphatase: 63 U/L (ref 39–117)
BUN: 13 mg/dL (ref 6–23)
CO2: 26 meq/L (ref 19–32)
Calcium: 8.8 mg/dL (ref 8.4–10.5)
Chloride: 104 meq/L (ref 96–112)
Creatinine, Ser: 0.87 mg/dL (ref 0.40–1.50)
GFR: 105.34 mL/min (ref >60.00)
Glucose, Bld: 101 mg/dL — ABNORMAL HIGH (ref 70–99)
Potassium: 4.3 meq/L (ref 3.5–5.1)
Sodium: 138 meq/L (ref 135–145)
Total Bilirubin: 0.6 mg/dL (ref 0.2–1.2)
Total Protein: 6.6 g/dL (ref 6.0–8.3)

## 2023-08-19 LAB — TSH: TSH: 1.24 u[IU]/mL (ref 0.35–5.50)

## 2023-08-20 ENCOUNTER — Ambulatory Visit (INDEPENDENT_AMBULATORY_CARE_PROVIDER_SITE_OTHER): Admitting: Family Medicine

## 2023-08-20 ENCOUNTER — Encounter: Payer: Self-pay | Admitting: Family Medicine

## 2023-08-20 VITALS — BP 122/74 | HR 63 | Temp 98.2°F | Ht 74.61 in | Wt 232.4 lb

## 2023-08-20 DIAGNOSIS — Z0000 Encounter for general adult medical examination without abnormal findings: Secondary | ICD-10-CM

## 2023-08-20 DIAGNOSIS — E039 Hypothyroidism, unspecified: Secondary | ICD-10-CM

## 2023-08-20 DIAGNOSIS — Z800 Family history of malignant neoplasm of digestive organs: Secondary | ICD-10-CM | POA: Insufficient documentation

## 2023-08-20 DIAGNOSIS — L409 Psoriasis, unspecified: Secondary | ICD-10-CM

## 2023-08-20 DIAGNOSIS — Z7189 Other specified counseling: Secondary | ICD-10-CM

## 2023-08-20 MED ORDER — TRIAMCINOLONE ACETONIDE 0.1 % EX CREA: 1.0000 | TOPICAL_CREAM | Freq: Two times a day (BID) | CUTANEOUS | Status: DC

## 2023-08-20 MED ORDER — LEVOTHYROXINE SODIUM 75 MCG PO TABS: ORAL_TABLET | ORAL | 3 refills | Status: AC

## 2023-08-20 NOTE — Progress Notes (Unsigned)
 CPE- See plan.  Routine anticipatory guidance given to patient.  See health maintenance.  The possibility exists that previously documented standard health maintenance information may have been brought forward from a previous encounter into this note.  If needed, that same information has been updated to reflect the current situation based on today's encounter.    Tetanus 2016 Flu d/w pt.  PNA and shingles not due.  covid vaccine prev done 2021 Prostate and colon cancer screening not due  D/w pt about diet and exercise.  Wife designated if patient were incapacitated.  [facility]    D/w pt about prev ER eval.  He changed his diet, cut back on sugar and overall improved.  He "fell off" his diet recently but is still working on that.  He cut out soda.  D/w pt about exercise.  I thanked him for his effort.  Hypothyroidism.   Compliant.  TSH normal.  Labs discussed with patient.  He has used topical tx prn for psoriasis.    PMH and SH reviewed  Meds, vitals, and allergies reviewed.   ROS: Per HPI.  Unless specifically indicated otherwise in HPI, the patient denies:  General: fever. Eyes: acute vision changes ENT: sore throat Cardiovascular: chest pain Respiratory: SOB GI: vomiting GU: dysuria Musculoskeletal: acute back pain Derm: acute rash Neuro: acute motor dysfunction Psych: worsening mood Endocrine: polydipsia Heme: bleeding Allergy: hayfever  GEN: nad, alert and oriented HEENT: ncat NECK: supple w/o LA CV: rrr. PULM: ctab, no inc wob ABD: soft, +bs EXT: no edema SKIN: Well-perfused  The 10-year ASCVD risk score (Arnett DK, et al., 2019) is: 1.9%   Values used to calculate the score:     Age: 43 years     Sex: Male     Is Non-Hispanic African American: No     Diabetic: No     Tobacco smoker: No     Systolic Blood Pressure: 122 mmHg     Is BP treated: No     HDL Cholesterol: 42.4 mg/dL     Total Cholesterol: 204  mg/dL

## 2023-08-20 NOTE — Patient Instructions (Signed)
Thanks for your effort.  Update me as needed.  Take care.  Glad to see you. 

## 2023-08-21 NOTE — Assessment & Plan Note (Signed)
 Wife designated if patient were incapacitated.

## 2023-08-21 NOTE — Assessment & Plan Note (Signed)
Continue levothyroxine as is. 

## 2023-08-21 NOTE — Assessment & Plan Note (Signed)
 Tetanus 2016 Flu d/w pt.  PNA and shingles not due.  covid vaccine prev done 2021 Prostate and colon cancer screening not due  D/w pt about diet and exercise.  Wife designated if patient were incapacitated.  [facility].

## 2023-08-21 NOTE — Assessment & Plan Note (Signed)
 Discussed that he can use triamcinolone topically as needed and update me as needed.

## 2023-09-10 ENCOUNTER — Other Ambulatory Visit: Payer: Self-pay | Admitting: Family Medicine

## 2023-11-30 DIAGNOSIS — H109 Unspecified conjunctivitis: Secondary | ICD-10-CM | POA: Diagnosis not present

## 2024-01-09 ENCOUNTER — Telehealth (INDEPENDENT_AMBULATORY_CARE_PROVIDER_SITE_OTHER): Payer: Self-pay | Admitting: Internal Medicine

## 2024-01-09 MED ORDER — CLOBETASOL PROPIONATE 0.05 % EX CREA: 1.0000 | TOPICAL_CREAM | Freq: Two times a day (BID) | CUTANEOUS | 1 refills | Status: DC | PRN

## 2024-01-09 NOTE — Telephone Encounter (Signed)
 Spoke with Kieth, he was clearing some brush at his house and was exposed to poison ivy has multiple spots of intensely pruritic maculopapular rash c/w poison ivy rxn.  Has tacrolimus ointment  (psoriasis)however unsure if he should use.   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  Discussed use of high potency steroid cream for short term use and relief. Do not use on face.  Rosan Dayton BROCKS, DO

## 2024-03-18 ENCOUNTER — Ambulatory Visit

## 2024-03-19 ENCOUNTER — Ambulatory Visit (INDEPENDENT_AMBULATORY_CARE_PROVIDER_SITE_OTHER)
Admission: RE | Admit: 2024-03-19 | Discharge: 2024-03-19 | Disposition: A | Source: Ambulatory Visit | Attending: Family Medicine | Admitting: Family Medicine

## 2024-03-19 ENCOUNTER — Ambulatory Visit: Payer: Self-pay | Admitting: Family Medicine

## 2024-03-19 ENCOUNTER — Encounter: Payer: Self-pay | Admitting: Family Medicine

## 2024-03-19 ENCOUNTER — Ambulatory Visit: Admitting: Family Medicine

## 2024-03-19 VITALS — BP 110/84 | HR 65 | Temp 97.5°F | Ht 74.5 in | Wt 232.0 lb

## 2024-03-19 DIAGNOSIS — S61412A Laceration without foreign body of left hand, initial encounter: Secondary | ICD-10-CM | POA: Diagnosis not present

## 2024-03-19 DIAGNOSIS — Z23 Encounter for immunization: Secondary | ICD-10-CM

## 2024-03-19 DIAGNOSIS — S61209A Unspecified open wound of unspecified finger without damage to nail, initial encounter: Secondary | ICD-10-CM

## 2024-03-19 DIAGNOSIS — S61213A Laceration without foreign body of left middle finger without damage to nail, initial encounter: Secondary | ICD-10-CM

## 2024-03-19 NOTE — Patient Instructions (Signed)
 Tetanus shot today (Td)   Xray of hand today We will reach out with results   Wash wound with soap and water twice daily  Do not submerge until healed  Antibiotic ointment or aquaphor is ok  Lightly cover to protect it from dirt   Watch for redness/swelling or pain  Please call if that is the case   Flu shot today

## 2024-03-19 NOTE — Assessment & Plan Note (Addendum)
 From a bench grinder - that cut/burned through a glove -resulting in avulsion injury  No bleeding/no signs of infection and minimal pain   Xray today to r/o fb -reassuring  Update Td  Wound care discussed -see AVS (stop peroxide and use soap and water instead)  Will need to heal by 2ndary intention  Watch for signs and symptoms of infection  Call back and Er precautions noted in detail today

## 2024-03-19 NOTE — Progress Notes (Signed)
 Subjective:    Patient ID: Larry Wilkinson, male    DOB: 1980/10/24, 43 y.o.   MRN: [id number]  HPI  Wt Readings from Last 3 Encounters:  03/19/24 232 lb (105.2 kg)  08/20/23 232 lb 6.4 oz (105.4 kg)  03/27/23 230 lb (104.3 kg)   29.39 kg/m  Vitals:   03/19/24 0933  BP: 110/84  Pulse: 65  Temp: (!) 97.5 F (36.4 C)  SpO2: 66%     43 year old pt of Dr Cleatus presents with finger injury  Also flu shot  PMH significant for psoriasis and hypothyroidism    Laceration of left middle finger Occurred on 03/17/24 Hit knuckle on bench grinder (burned through his glove and cut his finger)  Was grinding metal and glove was dirty  Little to no blood   No pain  No redness or drainage or swelling   Peroxide  Bacitracin     Tdap was 08/2014 Does not take any blood thinners   DG Hand Complete Left Addendum Date: 03/19/2024 ADDENDUM REPORT: 03/19/2024 10:59 Electronically Signed   By: Lynwood Landy Raddle M.D.   On: 03/19/2024 10:59   Result Date: 03/19/2024 CLINICAL DATA:  Left hand laceration. EXAM: LEFT HAND - COMPLETE 3+ VIEW COMPARISON:  None Available. FINDINGS: There is no evidence of fracture or dislocation. There is no evidence of arthropathy or other focal bone abnormality. Soft tissues are unremarkable. No radiopaque foreign body is noted. IMPRESSION: Negative. Electronically Signed: By: Lynwood Landy Raddle M.D. On: 03/19/2024 10:45     Patient Active Problem List   Diagnosis Date Noted   Wound, open, finger 03/19/2024   FH: colon cancer 08/20/2023   Foot pain 03/21/2022   Routine general medical examination at a health care facility 10/23/2017   GERD (gastroesophageal reflux disease) 01/18/2016   Advance care planning 01/21/2015   Environmental allergies 09/10/2013   Psoriasis 09/10/2013   Hypothyroidism 04/21/2007   Past Medical History:  Diagnosis Date   Ankle fracture, right 1996   ?; not sure if it was a break or growth plate separation   Anxiety    with  stress   Chronic tension headaches    High triglycerides    Hypothyroid    Nonspecific abnormal results of liver function study    Right arm fracture 1988   Right shoulder injury 2006   no surgery; no fracture   Sprain of ankle, unspecified site    Left   Past Surgical History:  Procedure Laterality Date   HERNIA REPAIR  1987   bilateral   WISDOM TOOTH EXTRACTION  2004   Social History   Tobacco Use   Smoking status: Former   Smokeless tobacco: Never  Substance Use Topics   Alcohol use: Yes    Comment: very rare   Drug use: No   Family History  Problem Relation Age of Onset   Healthy Mother    Healthy Father    Colon cancer Father    Healthy Sister        Conservation officer, historic buildings   Healthy Sister        halfsister   Healthy Brother        halfbrother   Healthy Brother        halfbrother   Prostate cancer Neg Hx    No Known Allergies Current Outpatient Medications on File Prior to Visit  Medication Sig Dispense Refill   clobetasol  cream (TEMOVATE ) 0.05 % Apply 1 Application topically 2 (two) times daily as needed (  rash). 30 g 1   levothyroxine  (SYNTHROID ) 75 MCG tablet TAKE 1 TABLET BY MOUTH EVERY DAY 90 tablet 3   triamcinolone  cream (KENALOG ) 0.1 % Apply 1 Application topically 2 (two) times daily. Apply a small amount to the affected area     No current facility-administered medications on file prior to visit.    Review of Systems     Objective:   Physical Exam Constitutional:      General: He is not in acute distress.    Appearance: Normal appearance. He is normal weight. He is not ill-appearing or diaphoretic.  Cardiovascular:     Rate and Rhythm: Normal rate and regular rhythm.     Pulses: Normal pulses.  Pulmonary:     Effort: Pulmonary effort is normal. No respiratory distress.  Musculoskeletal:     Comments: Normal rom of left hand and fingers   Skin:    General: Skin is warm and dry.     Findings: No bruising.     Comments: 1.5 cm wound -proximal  dorsal left 3rd finger near mcp Oval in shape/avulsion injury , fairly superficial  Healthy appearing tissue No erythema or drainage noted  Mildly tender  Tiny dark speck noted distally within wound     Neurological:     Mental Status: He is alert.     Sensory: No sensory deficit.  Psychiatric:        Mood and Affect: Mood normal.           Assessment & Plan:   Problem List Items Addressed This Visit       Other   Wound, open, finger - Primary   From a bench grinder - that cut/burned through a glove -resulting in avulsion injury  No bleeding/no signs of infection and minimal pain   Xray today to r/o fb -reassuring  Update Td  Wound care discussed -see AVS (stop peroxide and use soap and water instead)  Will need to heal by 2ndary intention  Watch for signs and symptoms of infection  Call back and Er precautions noted in detail today        Relevant Orders   DG Hand Complete Left (Completed)   Other Visit Diagnoses       Encounter for immunization       Relevant Orders   Flu vaccine trivalent PF, 6mos and older(Flulaval,Afluria,Fluarix,Fluzone) (Completed)

## 2024-06-26 ENCOUNTER — Ambulatory Visit: Payer: Self-pay

## 2024-06-26 NOTE — Telephone Encounter (Signed)
 Noted. Thanks.

## 2024-06-26 NOTE — Telephone Encounter (Signed)
 FYI Only or Action Required?: FYI only for provider: appointment scheduled on 1/19.  Patient was last seen in primary care on 03/19/2024 by Randeen Laine LABOR, MD.  Called Nurse Triage reporting Rectal Bleeding.  Symptoms began several days ago.  Interventions attempted: Rest, hydration, or home remedies.  Symptoms are: unchanged.  Triage Disposition: See PCP When Office is Open (Within 3 Days)  Patient/caregiver understands and will follow disposition?: Yes  Message from [facility] sent at 06/26/2024  8:21 AM EST  Summary: Blood in stool   Reason for Triage: Blood in stool         Reason for Disposition  MILD rectal bleeding (e.g., more than just a few drops or streaks)  Answer Assessment - Initial Assessment Questions Weds and Thurs night- blood in stool- red streaking around the outside of the stool. Stool a little lighter in color.   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   Straining - discomfort around the anus- not sure if he has hemorrhoids. Does sit for work.  Appt made for 1/19- patient will go to UC/ED over the weekend for any worsening.  1. APPEARANCE of BLOOD: What color is it? Is it passed separately, on the surface of the stool, or mixed in with the stool?      Burgundy red blood streaking around the stool and some in the toilet 2. AMOUNT: How much blood was passed?     Unsure amount- not a ton 3. FREQUENCY: How many times has blood been passed with the stools?      Twice  4. ONSET: When was the blood first seen in the stools? (Days or weeks)      Weds 5. DIARRHEA: Is there also some diarrhea? If Yes, ask: How many diarrhea stools in the past 24 hours?      Denies  6. CONSTIPATION: Do you have constipation? If Yes, ask: How bad is it?     Regular BM's but did start boot camp  this week  7. RECURRENT SYMPTOMS: Have you had blood in your stools before? If Yes, ask: When was the last time? and What happened that time?       Denies  8. BLOOD THINNERS: Do you take any blood thinners? (e.g., aspirin , clopidogrel / Plavix, coumadin, heparin). Notes: Other strong blood thinners include: Arixtra (fondaparinux), Eliquis (apixaban), Pradaxa (dabigatran), and Xarelto (rivaroxaban).     Denies  9. OTHER SYMPTOMS: Do you have any other symptoms?  (e.g., abdomen pain, vomiting, dizziness, fever)     denies  Protocols used: Rectal Bleeding-A-AH

## 2024-06-29 ENCOUNTER — Ambulatory Visit: Admitting: Family Medicine

## 2024-06-29 ENCOUNTER — Encounter: Payer: Self-pay | Admitting: Family Medicine

## 2024-06-29 VITALS — BP 138/82 | HR 69 | Temp 98.1°F | Ht 74.5 in | Wt 234.5 lb

## 2024-06-29 DIAGNOSIS — K921 Melena: Secondary | ICD-10-CM

## 2024-06-29 NOTE — Patient Instructions (Signed)
 Go to the lab on the way out.   If you have mychart we'll likely use that to update you.    Take care.  Glad to see you. Let me know if you can't get an appointment with the GI clinic.

## 2024-06-29 NOTE — Progress Notes (Unsigned)
 Blood recently noted in stool.  He went to a fitness boot camp.  Working out for 45 min in the AM, weight, calisthenics.  Then blood seen with BM.  Daily blood in stool, gradually with less seen per BM.  FH colon cancer.  No pain with BMs.  No black tarry stools.  No abd pain.    Meds, vitals, and allergies reviewed.   ROS: Per HPI unless specifically indicated in ROS section

## 2024-06-30 ENCOUNTER — Encounter: Payer: Self-pay | Admitting: Gastroenterology

## 2024-06-30 LAB — CBC WITH DIFFERENTIAL/PLATELET
Basophils Absolute: 0.1 K/uL (ref 0.0–0.1)
Basophils Relative: 1.3 % (ref 0.0–3.0)
Eosinophils Absolute: 0 K/uL (ref 0.0–0.7)
Eosinophils Relative: 0.7 % (ref 0.0–5.0)
HCT: 43.2 % (ref 39.0–52.0)
Hemoglobin: 15 g/dL (ref 13.0–17.0)
Lymphocytes Relative: 41.2 % (ref 12.0–46.0)
Lymphs Abs: 2.8 K/uL (ref 0.7–4.0)
MCHC: 34.6 g/dL (ref 30.0–36.0)
MCV: 87.9 fl (ref 78.0–100.0)
Monocytes Absolute: 0.6 K/uL (ref 0.1–1.0)
Monocytes Relative: 8.3 % (ref 3.0–12.0)
Neutro Abs: 3.3 K/uL (ref 1.4–7.7)
Neutrophils Relative %: 48.5 % (ref 43.0–77.0)
Platelets: 270 K/uL (ref 150.0–400.0)
RBC: 4.91 Mil/uL (ref 4.22–5.81)
RDW: 13.5 % (ref 11.5–15.5)
WBC: 6.8 K/uL (ref 4.0–10.5)

## 2024-07-01 ENCOUNTER — Ambulatory Visit: Payer: Self-pay | Admitting: Family Medicine

## 2024-07-01 DIAGNOSIS — K921 Melena: Secondary | ICD-10-CM | POA: Insufficient documentation

## 2024-07-01 NOTE — Assessment & Plan Note (Signed)
 I do not suspect an ominous diagnosis but given his family history it would be reasonable to see the GI clinic anyway, given his age.  See notes on labs.  Refer to GI.  Avoid straining with bowel movements and update me as needed.

## 2024-07-03 ENCOUNTER — Encounter: Payer: Self-pay | Admitting: Gastroenterology

## 2024-07-03 ENCOUNTER — Ambulatory Visit

## 2024-07-03 VITALS — Ht 74.5 in | Wt 225.0 lb

## 2024-07-03 DIAGNOSIS — Z800 Family history of malignant neoplasm of digestive organs: Secondary | ICD-10-CM

## 2024-07-03 DIAGNOSIS — K625 Hemorrhage of anus and rectum: Secondary | ICD-10-CM

## 2024-07-03 MED ORDER — NA SULFATE-K SULFATE-MG SULF 17.5-3.13-1.6 GM/177ML PO SOLN: 1.0000 | Freq: Once | ORAL | 0 refills | Status: AC

## 2024-07-03 NOTE — Progress Notes (Signed)
 PCP MD at time of PV: Cleatus, MD __________________________________________________________________________________________________________________________________________  No egg allergy known to patient  No soy allergy known to patient No issues known to pt with past sedation with any surgeries or procedures Patient denies ever being told they had issues or difficulty with intubation  No FH of Malignant Hyperthermia Pt is not on diet pills Pt is not on  home 02  Pt is not on blood thinners  No A fib or A flutter Have any cardiac testing pending-- no  LOA: independent  No Chew or Snuff tobacco __________________________________________________________________________________________________________________________________________  Constipation: no  Prep: suprep  __________________________________________________________________________________________________________________________________________  PV completed with patient. Prep instructions reviewed and provided during apt. Rx sent to preferred pharmacy.  _____________________________________________________________________

## 2024-07-08 ENCOUNTER — Encounter: Payer: Self-pay | Admitting: Gastroenterology

## 2024-07-08 ENCOUNTER — Ambulatory Visit: Admitting: Gastroenterology

## 2024-07-08 VITALS — BP 129/89 | HR 73 | Temp 98.0°F | Resp 11 | Ht 74.0 in | Wt 225.0 lb

## 2024-07-08 DIAGNOSIS — K5730 Diverticulosis of large intestine without perforation or abscess without bleeding: Secondary | ICD-10-CM

## 2024-07-08 DIAGNOSIS — D127 Benign neoplasm of rectosigmoid junction: Secondary | ICD-10-CM | POA: Diagnosis not present

## 2024-07-08 DIAGNOSIS — K648 Other hemorrhoids: Secondary | ICD-10-CM

## 2024-07-08 DIAGNOSIS — Z800 Family history of malignant neoplasm of digestive organs: Secondary | ICD-10-CM | POA: Diagnosis not present

## 2024-07-08 DIAGNOSIS — Z1211 Encounter for screening for malignant neoplasm of colon: Secondary | ICD-10-CM

## 2024-07-08 DIAGNOSIS — D125 Benign neoplasm of sigmoid colon: Secondary | ICD-10-CM

## 2024-07-08 DIAGNOSIS — K625 Hemorrhage of anus and rectum: Secondary | ICD-10-CM

## 2024-07-08 MED ORDER — SODIUM CHLORIDE 0.9 % IV SOLN: 500.0000 mL | Freq: Once | INTRAVENOUS | Status: DC

## 2024-07-08 NOTE — Op Note (Signed)
 [facility] Patient Name: Larry Wilkinson Procedure Date: 07/08/2024 10:40 AM MRN: [id number] Endoscopist: Elspeth P. Leigh , MD, [id number] Age: 44 Referring MD:  Date of Birth: 02-Mar-1981 Gender: Male Account #: [id number] Procedure:                Colonoscopy Indications:              Screening in patient at increased risk: Family                            history of 1st-degree relative with colorectal                            cancer - father had metastatic colon cancer dx age                            78, This is the patient's first colonoscopy.                            Recently has had some rectal bleeding. Medicines:                Monitored Anesthesia Care Procedure:                Pre-Anesthesia Assessment:                           - Prior to the procedure, a History and Physical                            was performed, and patient medications and                            allergies were reviewed. The patient's tolerance of                            previous anesthesia was also reviewed. The risks                            and benefits of the procedure and the sedation                            options and risks were discussed with the patient.                            All questions were answered, and informed consent                            was obtained. Prior Anticoagulants: The patient has                            taken no anticoagulant or antiplatelet agents. ASA                            Grade Assessment: II - A patient with mild systemic  disease. After reviewing the risks and benefits,                            the patient was deemed in satisfactory condition to                            undergo the procedure.                           After obtaining informed consent, the colonoscope                            was passed under direct vision. Throughout the                            procedure, the patient's  blood pressure, pulse, and                            oxygen saturations were monitored continuously. The                            CF [id number] #[id number] was introduced through the anus                            and advanced to the the terminal ileum, with                            identification of the appendiceal orifice and IC                            valve. The colonoscopy was performed without                            difficulty. The patient tolerated the procedure                            well. The quality of the bowel preparation was                            good. The terminal ileum, ileocecal valve,                            appendiceal orifice, and rectum were photographed. Scope In: 11:00:52 AM Scope Out: 11:23:38 AM Scope Withdrawal Time: 0 hours 20 minutes 3 seconds  Total Procedure Duration: 0 hours 22 minutes 46 seconds  Findings:                 The perianal and digital rectal examinations were                            normal.                           The terminal ileum appeared normal.  A 15 to 20 mm polyp was found in the sigmoid colon.                            The polyp was pedunculated with stigmata of recent                            bleeding, heme noted in the area. The polyp was                            removed with a hot snare. Resection and retrieval                            were complete. To prevent bleeding after the                            polypectomy, one hemostatic clip was successfully                            placed across the base of the polypectomy site.                            Area across from and just inferior to the site the                            was tattooed with an injection of Spot (carbon                            black).                           A 5 mm polyp was found in the recto-sigmoid colon.                            The polyp was sessile. The polyp was removed with a                             cold snare. Resection and retrieval were complete.                           A few diverticula were found in the ascending colon                            and left colon.                           Internal hemorrhoids were found during retroflexion.                           The exam was otherwise without abnormality. Complications:            No immediate complications. Estimated blood loss:  Minimal. Estimated Blood Loss:     Estimated blood loss was minimal. Impression:               - The examined portion of the ileum was normal.                           - One 15 to 20 mm polyp in the sigmoid colon,                            removed with a hot snare. Resected and retrieved.                            Clip was placed. Tattooed.                           - One 5 mm polyp at the recto-sigmoid colon,                            removed with a cold snare. Resected and retrieved.                           - Diverticulosis in the ascending colon and in the                            left colon.                           - Internal hemorrhoids.                           - The examination was otherwise normal.                           Large sigmoid polyp I suspect was the source of                            bleeding. Recommendation:           - Patient has a contact number available for                            emergencies. The signs and symptoms of potential                            delayed complications were discussed with the                            patient. Return to normal activities tomorrow.                            Written discharge instructions were provided to the                            patient.                           -  Resume previous diet.                           - Continue present medications.                           - Await pathology results. Anticipate repeat                            colonoscopy in 3 years, pending  pathology results.                           - No ibuprofen, naproxen , or other non-steroidal                            anti-inflammatory drugs for 2 weeks after polyp                            removal. Elspeth P. Aarin Bluett, MD 07/08/2024 11:34:27 AM This report has been signed electronically.

## 2024-07-08 NOTE — Progress Notes (Signed)
 Pt's states no medical or surgical changes since previsit or office visit.

## 2024-07-08 NOTE — Progress Notes (Signed)
 Called to room to assist during endoscopic procedure.  Patient ID and intended procedure confirmed with present staff. Received instructions for my participation in the procedure from the performing physician.

## 2024-07-08 NOTE — Progress Notes (Signed)
 [facility] History and Physical   Primary Care Physician:  Cleatus Arlyss RAMAN, MD   Reason for Procedure:   Family history of colon cancer, rectal bleeding  Plan:    colonoscopy     HPI: Larry Wilkinson is a 44 y.o. male  here for colonoscopy - father had CRC dx age 64s. Patient has had some intermittent rectal bleeding recently which has since stopped within recent days.  No prior colonoscopy.   Otherwise feels well without any cardiopulmonary symptoms.   I have discussed risks / benefits of anesthesia and endoscopic procedure with Lincoln R Hamre and they wish to proceed with the exams as outlined today.   The patient was provided an opportunity to ask questions and all were answered. The patient agreed with the plan.    Past Medical History:  Diagnosis Date   Ankle fracture, right 1996   ?; not sure if it was a break or growth plate separation   Anxiety    with stress   Chronic tension headaches    High triglycerides    Hypothyroid    Nonspecific abnormal results of liver function study    Right arm fracture 1988   Right shoulder injury 2006   no surgery; no fracture   Sprain of ankle, unspecified site    Left    Past Surgical History:  Procedure Laterality Date   HERNIA REPAIR  1987   bilateral   WISDOM TOOTH EXTRACTION  2004    Prior to Admission medications  Medication Sig Start Date End Date Taking? Authorizing Provider  levothyroxine  (SYNTHROID ) 75 MCG tablet TAKE 1 TABLET BY MOUTH EVERY DAY 08/20/23  Yes Cleatus Arlyss RAMAN, MD    Current Outpatient Medications  Medication Sig Dispense Refill   levothyroxine  (SYNTHROID ) 75 MCG tablet TAKE 1 TABLET BY MOUTH EVERY DAY 90 tablet 3   Current Facility-Administered Medications  Medication Dose Route Frequency Provider Last Rate Last Admin   0.9 %  sodium chloride  infusion  500 mL Intravenous Once Lakyra Tippins, Elspeth SQUIBB, MD        Allergies as of 07/08/2024   (No Known Allergies)    Family History   Problem Relation Age of Onset   Healthy Mother    Colon cancer Father 49   Healthy Sister        conservation officer, historic buildings   Healthy Sister        conservation officer, historic buildings   Healthy Brother        halfbrother   Healthy Brother        halfbrother   Prostate cancer Neg Hx    Rectal cancer Neg Hx    Stomach cancer Neg Hx     Social History   Socioeconomic History   Marital status: Married    Spouse name: Not on file   Number of children: Not on file   Years of education: Not on file   Highest education level: Not on file  Occupational History   Occupation: Nature conservation officer  Tobacco Use   Smoking status: Former   Smokeless tobacco: Never  Substance and Sexual Activity   Alcohol use: Yes    Comment: very rare   Drug use: No   Sexual activity: Not on file  Other Topics Concern   Not on file  Social History Narrative   Married 10/2008   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   From [place], in [place] since 1997   Exercising/cycling   Son Dorn born 2016   Social Drivers of  Health   Tobacco Use: Medium Risk (07/08/2024)   Patient History    Smoking Tobacco Use: Former    Smokeless Tobacco Use: Never    Passive Exposure: Not on Actuary Strain: Not on file  Food Insecurity: Not on file  Transportation Needs: Not on file  Physical Activity: Not on file  Stress: Not on file  Social Connections: Unknown (10/24/2021)   Received from [facility]   Social Network    Social Network: Not on file  Intimate Partner Violence: Unknown (09/15/2021)   Received from [facility]   HITS    Physically Hurt: Not on file    Insult or Talk Down To: Not on file    Threaten Physical Harm: Not on file    Scream or Curse: Not on file  Depression (PHQ2-9): Low Risk (06/29/2024)   Depression (PHQ2-9)    PHQ-2 Score: 0  Alcohol Screen: Not on file  Housing: Not on file  Utilities: Not on file  Health Literacy: Not on file    Review of Systems: All other review of systems negative except  as mentioned in the HPI.  Physical Exam: Vital signs BP 133/82   Pulse 74   Temp 98 F (36.7 C)   Ht 6' 2 (1.88 m)   Wt 225 lb (102.1 kg)   SpO2 100%   BMI 28.89 kg/m   General:   Alert,  Well-developed, pleasant and cooperative in NAD Lungs:  Clear throughout to auscultation.   Heart:  Regular rate and rhythm Abdomen:  Soft, nontender and nondistended.   Neuro/Psych:  Alert and cooperative. Normal mood and affect. A and O x 3  Marcey Naval, MD [facility]

## 2024-07-08 NOTE — Progress Notes (Signed)
 Report given to PACU, vss

## 2024-07-08 NOTE — Patient Instructions (Signed)
 YOU HAD AN ENDOSCOPIC PROCEDURE TODAY AT THE [facility]:   Refer to the procedure report that was given to you for any specific questions about what was found during the examination.  If the procedure report does not answer your questions, please call your gastroenterologist to clarify.  If you requested that your care partner not be given the details of your procedure findings, then the procedure report has been included in a sealed envelope for you to review at your convenience later.  YOU SHOULD EXPECT: Some feelings of bloating in the abdomen. Passage of more gas than usual.  Walking can help get rid of the air that was put into your GI tract during the procedure and reduce the bloating. If you had a lower endoscopy (such as a colonoscopy or flexible sigmoidoscopy) you may notice spotting of blood in your stool or on the toilet paper. If you underwent a bowel prep for your procedure, you may not have a normal bowel movement for a few days.  Please Note:  You might notice some irritation and congestion in your nose or some drainage.  This is from the oxygen used during your procedure.  There is no need for concern and it should clear up in a day or so.  SYMPTOMS TO REPORT IMMEDIATELY:  Following lower endoscopy (colonoscopy or flexible sigmoidoscopy):  Excessive amounts of blood in the stool  Significant tenderness or worsening of abdominal pains  Swelling of the abdomen that is new, acute  Fever of 100F or higher  Resume previous diet Continue present medications Await pathology results.  Anticipate repeat colonoscopy in 3 years, pending pathology results No ibuprofen, naproxen  or other non steroidal anti inflammatory drugs for 2 weeks after polyp removal Handouts on diverticulosis and hemorrhoids given   For urgent or emergent issues, a gastroenterologist can be reached at any hour by calling [phone]. Do not use MyChart messaging for urgent concerns.    DIET:  We  do recommend a small meal at first, but then you may proceed to your regular diet.  Drink plenty of fluids but you should avoid alcoholic beverages for 24 hours.  ACTIVITY:  You should plan to take it easy for the rest of today and you should NOT DRIVE or use heavy machinery until tomorrow (because of the sedation medicines used during the test).    FOLLOW UP: Our staff will call the number listed on your records the next business day following your procedure.  We will call around 7:15- 8:00 am to check on you and address any questions or concerns that you may have regarding the information given to you following your procedure. If we do not reach you, we will leave a message.     If any biopsies were taken you will be contacted by phone or by letter within the next 1-3 weeks.  Please call us  at [phone] if you have not heard about the biopsies in 3 weeks.    SIGNATURES/CONFIDENTIALITY: You and/or your care partner have signed paperwork which will be entered into your electronic medical record.  These signatures attest to the fact that that the information above on your After Visit Summary has been reviewed and is understood.  Full responsibility of the confidentiality of this discharge information lies with you and/or your care-partner.

## 2024-07-09 ENCOUNTER — Telehealth: Payer: Self-pay

## 2024-07-09 NOTE — Telephone Encounter (Signed)
" °  Follow up Call-     07/08/2024   10:28 AM  Call back number  Post procedure Call Back phone  # [phone]  Permission to leave phone message Yes     Left meesage  "

## 2024-07-14 ENCOUNTER — Ambulatory Visit: Payer: Self-pay | Admitting: Gastroenterology

## 2024-07-14 ENCOUNTER — Encounter: Payer: Self-pay | Admitting: Gastroenterology

## 2024-07-14 LAB — SURGICAL PATHOLOGY

## 2024-07-27 ENCOUNTER — Ambulatory Visit: Admitting: Gastroenterology
